# Patient Record
Sex: Female | Born: 1965 | Race: White | Hispanic: No | Marital: Married | Smoking: Never smoker
Health system: Southern US, Community
[De-identification: ages and names within clinical notes are randomized; demographics above are authoritative.]

## PROBLEM LIST (undated history)

## (undated) DIAGNOSIS — M1990 Unspecified osteoarthritis, unspecified site: Secondary | ICD-10-CM

## (undated) DIAGNOSIS — Z8489 Family history of other specified conditions: Secondary | ICD-10-CM

## (undated) DIAGNOSIS — I10 Essential (primary) hypertension: Secondary | ICD-10-CM

## (undated) DIAGNOSIS — K219 Gastro-esophageal reflux disease without esophagitis: Secondary | ICD-10-CM

## (undated) DIAGNOSIS — C801 Malignant (primary) neoplasm, unspecified: Secondary | ICD-10-CM

## (undated) HISTORY — PX: TOOTH EXTRACTION: SUR596

## (undated) HISTORY — DX: Essential (primary) hypertension: I10

## (undated) HISTORY — PX: MYRINGOTOMY: SUR874

## (undated) HISTORY — PX: MULTIPLE TOOTH EXTRACTIONS: SHX2053

## (undated) HISTORY — PX: OTHER SURGICAL HISTORY: SHX169

---

## 1998-12-28 ENCOUNTER — Other Ambulatory Visit: Admission: RE | Admit: 1998-12-28 | Discharge: 1998-12-28 | Payer: Self-pay | Admitting: *Deleted

## 2000-01-06 ENCOUNTER — Other Ambulatory Visit: Admission: RE | Admit: 2000-01-06 | Discharge: 2000-01-06 | Payer: Self-pay | Admitting: *Deleted

## 2000-02-20 ENCOUNTER — Other Ambulatory Visit: Admission: RE | Admit: 2000-02-20 | Discharge: 2000-02-20 | Payer: Self-pay | Admitting: *Deleted

## 2000-06-22 ENCOUNTER — Other Ambulatory Visit: Admission: RE | Admit: 2000-06-22 | Discharge: 2000-06-22 | Payer: Self-pay | Admitting: *Deleted

## 2016-08-31 ENCOUNTER — Other Ambulatory Visit: Payer: Self-pay | Admitting: Internal Medicine

## 2016-08-31 ENCOUNTER — Ambulatory Visit
Admission: RE | Admit: 2016-08-31 | Discharge: 2016-08-31 | Disposition: A | Discharge status: Home / Self Care | Payer: BLUE CROSS/BLUE SHIELD | Source: Ambulatory Visit | Attending: Internal Medicine | Admitting: Internal Medicine

## 2016-08-31 DIAGNOSIS — M25551 Pain in right hip: Secondary | ICD-10-CM

## 2016-09-04 ENCOUNTER — Other Ambulatory Visit: Payer: Self-pay | Admitting: Internal Medicine

## 2016-09-04 DIAGNOSIS — Z1231 Encounter for screening mammogram for malignant neoplasm of breast: Secondary | ICD-10-CM

## 2016-09-11 ENCOUNTER — Ambulatory Visit
Admission: RE | Admit: 2016-09-11 | Discharge: 2016-09-11 | Disposition: A | Discharge status: Home / Self Care | Payer: BLUE CROSS/BLUE SHIELD | Source: Ambulatory Visit | Attending: Internal Medicine | Admitting: Internal Medicine

## 2016-09-11 DIAGNOSIS — Z1231 Encounter for screening mammogram for malignant neoplasm of breast: Secondary | ICD-10-CM

## 2016-09-12 ENCOUNTER — Other Ambulatory Visit: Payer: Self-pay | Admitting: Internal Medicine

## 2016-09-12 DIAGNOSIS — R928 Other abnormal and inconclusive findings on diagnostic imaging of breast: Secondary | ICD-10-CM

## 2016-09-15 ENCOUNTER — Other Ambulatory Visit: Payer: Self-pay | Admitting: Internal Medicine

## 2016-09-15 ENCOUNTER — Ambulatory Visit
Admission: RE | Admit: 2016-09-15 | Discharge: 2016-09-15 | Disposition: A | Discharge status: Home / Self Care | Payer: BLUE CROSS/BLUE SHIELD | Source: Ambulatory Visit | Attending: Internal Medicine | Admitting: Internal Medicine

## 2016-09-15 DIAGNOSIS — R928 Other abnormal and inconclusive findings on diagnostic imaging of breast: Secondary | ICD-10-CM

## 2016-09-15 DIAGNOSIS — N63 Unspecified lump in unspecified breast: Secondary | ICD-10-CM

## 2016-09-19 ENCOUNTER — Ambulatory Visit (INDEPENDENT_AMBULATORY_CARE_PROVIDER_SITE_OTHER): Payer: BLUE CROSS/BLUE SHIELD | Admitting: Orthopaedic Surgery

## 2016-09-19 ENCOUNTER — Other Ambulatory Visit (INDEPENDENT_AMBULATORY_CARE_PROVIDER_SITE_OTHER): Payer: Self-pay

## 2016-09-19 ENCOUNTER — Encounter (INDEPENDENT_AMBULATORY_CARE_PROVIDER_SITE_OTHER): Payer: Self-pay | Admitting: Orthopaedic Surgery

## 2016-09-19 DIAGNOSIS — M25551 Pain in right hip: Secondary | ICD-10-CM

## 2016-09-19 DIAGNOSIS — M1611 Unilateral primary osteoarthritis, right hip: Secondary | ICD-10-CM | POA: Insufficient documentation

## 2016-09-19 NOTE — Progress Notes (Signed)
Office Visit Note   Patient: Stacy [name]           Date of Birth: 02/18/1966           MRN: [id number] Visit Date: 09/19/2016              Requested by: Audley Hose, MD [place], [place] [address] PCP: Audley Hose, MD   Assessment & Plan: Visit Diagnoses:  1. Pain of right hip joint   2. Unilateral primary osteoarthritis, right hip     Plan: Given the severity of her hip disease I do feel that she is a candidate for hip replacement surgery. We had a long and thorough discussion about this and I showed her hip model and went over x-rays as well. She would like to try an intra-articular steroid injection in her right hip and I do feel this is reasonable. We will set this up with Dr. Ernestina Patches. I will see her back myself in 4 weeks to see how she is doing overall. All questions were encouraged and answered.  Follow-Up Instructions: Return in about 4 weeks (around 10/17/2016).   Orders:  No orders of the defined types were placed in this encounter.  No orders of the defined types were placed in this encounter.     Procedures: No procedures performed   Clinical Data: No additional findings.   Subjective: Chief Complaint  Patient presents with  . Right Hip - Pain  The patient is a very pleasant 51 year old female who comes in with chief complaint of right hip pain. It has been slowly worsening for 5-1/2 years now. She states the pain is in her groin and all around her right hip. It is worse with activities. She states that the entire hip hurts. She says that it locks up with activities. She says it does get better with movement after about 15-20 minutes. After she's been sitting for a while and gets up the hip is very stiff. She does take ibuprofen or Aleve. She is working activity modification and weight loss. He can be 10 out of 10 at times. It is detrimentally affected activities daily living. Her quality of life. Her  mobility.  HPI  Review of Systems She denies any chest pain, shortness of breath, fever, chills, nausea, vomiting.  Objective: Vital Signs: There were no vitals taken for this visit.  Physical Exam She is alert or 3 and in no acute distress. She walks with a limp but no assistive device. Ortho Exam Examination of her left hip is normal with full and fluid range of motion. Examination of right hip shows blocks to internal/external rotation some due to pain and some due to decreased dexterity of the hip itself. Her leg lengths are equal. Her knee exam is normal bilaterally. Specialty Comments:  No specialty comments available.  Imaging: No results found. X-rays on the canopy system independently reviewed by me of her pelvis and right hip show severe end-stage arthritis of the right hip. There is complete loss of superior lateral joint space. There is a trigger osteophytes. Severe joint space narrowing is recognized. There is cystic changes and sclerotic changes in the femoral head and the acetabulum.  PMFS History: Patient Active Problem List   Diagnosis Date Noted  . Pain of right hip joint 09/19/2016  . Unilateral primary osteoarthritis, right hip 09/19/2016   No past medical history on file.  Family History  Problem Relation Age of Onset  . Breast  cancer Mother     No past surgical history on file. Social History   Occupational History  . Not on file.   Social History Main Topics  . Smoking status: Never Smoker  . Smokeless tobacco: Never Used  . Alcohol use Not on file  . Drug use: Unknown  . Sexual activity: Not on file

## 2016-09-25 ENCOUNTER — Ambulatory Visit (INDEPENDENT_AMBULATORY_CARE_PROVIDER_SITE_OTHER): Payer: BLUE CROSS/BLUE SHIELD

## 2016-09-25 ENCOUNTER — Encounter (INDEPENDENT_AMBULATORY_CARE_PROVIDER_SITE_OTHER): Payer: Self-pay | Admitting: Physical Medicine and Rehabilitation

## 2016-09-25 ENCOUNTER — Ambulatory Visit (INDEPENDENT_AMBULATORY_CARE_PROVIDER_SITE_OTHER): Payer: BLUE CROSS/BLUE SHIELD | Admitting: Physical Medicine and Rehabilitation

## 2016-09-25 DIAGNOSIS — M25551 Pain in right hip: Secondary | ICD-10-CM | POA: Diagnosis not present

## 2016-09-25 MED ORDER — TRIAMCINOLONE ACETONIDE 40 MG/ML IJ SUSP
80.0000 mg | INTRAMUSCULAR | Status: AC | PRN
  Administered 2016-09-25: 80 mg via INTRA_ARTICULAR

## 2016-09-25 MED ORDER — BUPIVACAINE HCL 0.5 % IJ SOLN
3.0000 mL | INTRAMUSCULAR | Status: AC | PRN
  Administered 2016-09-25: 3 mL via INTRA_ARTICULAR

## 2016-09-25 NOTE — Progress Notes (Deleted)
Right hip pain for several years. Comes and goes. Pain with walking and getting up from seated position. Gets very stiff after sitting long periods. Denies any groin pain or pain down leg.

## 2016-09-25 NOTE — Patient Instructions (Signed)

## 2016-09-25 NOTE — Progress Notes (Signed)
Stacy [name] - 51 y.o. female MRN [id number]  Date of birth: 11-15-1965  Office Visit Note: Visit Date: 09/25/2016 PCP: Audley Hose, MD Referred by: Audley Hose, MD  Subjective: Chief Complaint  Patient presents with  . Right Hip - Pain   HPI: Ms. Stacy [name] is a 51 year old female with several year history of right hip pain. Her hip pain can be intermittent at times but severe at times. She has a lot of pain with walking and getting up from a seated position. She has a lot of stiffness particularly on the right low back and hip area. She denies any real specific groin pain really pain down the leg or paresthesias. She does get locking type pain in the right hip. She saw Dr. Ninfa Linden recently for evaluation and management. X-rays have shown end-stage arthritis of the right hip. She is somewhat anxious today about the injection.    ROS Otherwise per HPI.  Assessment & Plan: Visit Diagnoses:  1. Pain in right hip     Plan: Findings:  Diagnostic and therapeutic anesthetic hip arthrogram. The patient did get relief of her hip pain during the anesthetic phase of the injection. The patient had quite a bit of pain during the delivery of the injectate I think this will volume standpoint. She did have a vasovagal reaction as well at this point that did okay ultimately. She was discharged without problem.    Meds & Orders: No orders of the defined types were placed in this encounter.   Orders Placed This Encounter  Procedures  . Large Joint Injection/Arthrocentesis  . XR C-ARM NO REPORT    Follow-up: Return for Dr. Ninfa Linden.   Procedures: Diagnostic and therapeutic anesthetic hip arthrogram Date/Time: 09/25/2016 1:21 PM Performed by: Magnus Sinning Authorized by: Magnus Sinning   Consent Given by:  Patient Site marked: the procedure site was marked   Timeout: prior to procedure the correct patient, procedure, and site was verified   Indications:  Pain and diagnostic  evaluation Location:  Hip Site:  R hip joint Prep: patient was prepped and draped in usual sterile fashion   Needle Size:  22 G Approach:  Anterior Ultrasound Guidance: No   Fluoroscopic Guidance: No   Arthrogram: Yes   Medications:  3 mL bupivacaine 0.5 %; 80 mg triamcinolone acetonide 40 MG/ML Aspiration Attempted: Yes   Patient tolerance of procedure: Patient tolerated the actual injection without difficulty but during the injectate delivery from a volume standpoint she had quite a bit of pain. She also had a vasovagal reaction at that point but was discharged without problems.  Arthrogram demonstrated excellent flow of contrast throughout the joint surface without extravasation or obvious defect.  The patient had relief of symptoms during the anesthetic phase of the injection.      No notes on file   Clinical History: No specialty comments available.  She reports that she has never smoked. She has never used smokeless tobacco. No results for input(s): HGBA1C, LABURIC in the last 8760 hours.  Objective:  VS:  HT:    WT:   BMI:     BP:   HR: bpm  TEMP: ( )  RESP:  Physical Exam  Ortho Exam Imaging: Xr C-arm No Report  Result Date: 09/25/2016 Please see Notes or Procedures tab for imaging impression.   Past Medical/Family/Surgical/Social History: Medications & Allergies reviewed per EMR Patient Active Problem List   Diagnosis Date Noted  . Pain of right hip joint 09/19/2016  .  Unilateral primary osteoarthritis, right hip 09/19/2016   No past medical history on file. Family History  Problem Relation Age of Onset  . Breast cancer Mother    No past surgical history on file. Social History   Occupational History  . Not on file.   Social History Main Topics  . Smoking status: Never Smoker  . Smokeless tobacco: Never Used  . Alcohol use Not on file  . Drug use: Unknown  . Sexual activity: Not on file

## 2016-10-04 ENCOUNTER — Ambulatory Visit (INDEPENDENT_AMBULATORY_CARE_PROVIDER_SITE_OTHER): Payer: BLUE CROSS/BLUE SHIELD | Admitting: Obstetrics & Gynecology

## 2016-10-04 ENCOUNTER — Encounter: Payer: Self-pay | Admitting: Obstetrics & Gynecology

## 2016-10-04 ENCOUNTER — Ambulatory Visit (INDEPENDENT_AMBULATORY_CARE_PROVIDER_SITE_OTHER): Payer: BLUE CROSS/BLUE SHIELD | Admitting: Clinical

## 2016-10-04 VITALS — BP 162/82 | HR 86 | Ht 65.5 in | Wt 169.5 lb

## 2016-10-04 DIAGNOSIS — F4320 Adjustment disorder, unspecified: Secondary | ICD-10-CM | POA: Diagnosis not present

## 2016-10-04 DIAGNOSIS — Z01419 Encounter for gynecological examination (general) (routine) without abnormal findings: Secondary | ICD-10-CM | POA: Diagnosis not present

## 2016-10-04 DIAGNOSIS — N951 Menopausal and female climacteric states: Secondary | ICD-10-CM

## 2016-10-04 DIAGNOSIS — G479 Sleep disorder, unspecified: Secondary | ICD-10-CM

## 2016-10-04 DIAGNOSIS — F4321 Adjustment disorder with depressed mood: Secondary | ICD-10-CM

## 2016-10-04 MED ORDER — CONJ ESTROG-MEDROXYPROGEST ACE 0.625-2.5 MG PO TABS: 1.0000 | ORAL_TABLET | Freq: Every day | ORAL | 3 refills | Status: DC

## 2016-10-04 NOTE — Patient Instructions (Addendum)
Perimenopause Perimenopause is the time when your body begins to move into the menopause (no menstrual period for 12 straight months). It is a natural process. Perimenopause can begin 2-8 years before the menopause and usually lasts for 1 year after the menopause. During this time, your ovaries may or may not produce an egg. The ovaries vary in their production of estrogen and progesterone hormones each month. This can cause irregular menstrual periods, difficulty getting pregnant, vaginal bleeding between periods, and uncomfortable symptoms. What are the causes?  Irregular production of the ovarian hormones, estrogen and progesterone, and not ovulating every month. Other causes include:  Tumor of the pituitary gland in the brain.  Medical disease that affects the ovaries.  Radiation treatment.  Chemotherapy.  Unknown causes.  Heavy smoking and excessive alcohol intake can bring on perimenopause sooner.  What are the signs or symptoms?  Hot flashes.  Night sweats.  Irregular menstrual periods.  Decreased sex drive.  Vaginal dryness.  Headaches.  Mood swings.  Depression.  Memory problems.  Irritability.  Tiredness.  Weight gain.  Trouble getting pregnant.  The beginning of losing bone cells (osteoporosis).  The beginning of hardening of the arteries (atherosclerosis). How is this diagnosed? Your health care provider will make a diagnosis by analyzing your age, menstrual history, and symptoms. He or she will do a physical exam and note any changes in your body, especially your female organs. Female hormone tests may or may not be helpful depending on the amount of female hormones you produce and when you produce them. However, other hormone tests may be helpful to rule out other problems. How is this treated? In some cases, no treatment is needed. The decision on whether treatment is necessary during the perimenopause should be made by you and your health care  provider based on how the symptoms are affecting you and your lifestyle. Various treatments are available, such as:  Treating individual symptoms with a specific medicine for that symptom.  Herbal medicines that can help specific symptoms.  Counseling.  Group therapy.  Follow these instructions at home:  Keep track of your menstrual periods (when they occur, how heavy they are, how long between periods, and how long they last) as well as your symptoms and when they started.  Only take over-the-counter or prescription medicines as directed by your health care provider.  Sleep and rest.  Exercise.  Eat a diet that contains calcium (good for your bones) and soy (acts like the estrogen hormone).  Do not smoke.  Avoid alcoholic beverages.  Take vitamin supplements as recommended by your health care provider. Taking vitamin E may help in certain cases.  Take calcium and vitamin D supplements to help prevent bone loss.  Group therapy is sometimes helpful.  Acupuncture may help in some cases. Contact a health care provider if:  You have questions about any symptoms you are having.  You need a referral to a specialist (gynecologist, psychiatrist, or psychologist). Get help right away if:  You have vaginal bleeding.  Your period lasts longer than 8 days.  Your periods are recurring sooner than 21 days.  You have bleeding after intercourse.  You have severe depression.  You have pain when you urinate.  You have severe headaches.  You have vision problems. This information is not intended to replace advice given to you by your health care provider. Make sure you discuss any questions you have with your health care provider. Document Released: 04/06/2004 Document Revised: 08/05/2015 Document Reviewed: 09/26/2012  severe depression.  · You have pain when you urinate.  · You have severe headaches.  · You have vision problems.  This information is not intended to replace advice given to you by your health care provider. Make sure you discuss any questions you have with your health care provider.  Document Released: 04/06/2004 Document Revised: 08/05/2015 Document Reviewed: 09/26/2012  Elsevier Interactive Patient Education © 2017 [employer]      Menopause and Hormone Replacement Therapy  What is hormone replacement therapy?  Hormone  replacement therapy (HRT) is the use of artificial (synthetic) hormones to replace hormones that your body stops producing during menopause.  Menopause is the normal time of life when menstrual periods stop completely and the ovaries stop producing the female hormones estrogen and progesterone. This lack of hormones can affect your health and cause undesirable symptoms. HRT can relieve some of those symptoms.  What are my options for HRT?  HRT may consist of the synthetic hormones estrogen and progestin, or it may consist of only estrogen (estrogen-only therapy). You and your health care provider will decide which form of HRT is best for you.  If you choose to be on HRT and you have a uterus, estrogen and progestin are usually prescribed. Estrogen-only therapy is used for women who do not have a uterus.  Possible options for taking HRT include:  · Pills.  · Patches.  · Gels.  · Sprays.  · Vaginal cream.  · Vaginal rings.  · Vaginal inserts.    The amount of hormone(s) that you take and how long you take the hormone(s) varies depending on your individual health. It is important to:  · Begin HRT with the lowest possible dosage.  · Stop HRT as soon as your health care provider tells you to stop.  · Work with your health care provider so that you feel informed and comfortable with your decisions.    What are the benefits of HRT?  HRT can reduce the frequency and severity of menopausal symptoms. Benefits of HRT vary depending on the menopausal symptoms that you have, the severity of your symptoms, and your overall health.  HRT may help to improve the following menopausal symptoms:  · Hot flashes and night sweats. These are sudden feelings of heat that spread over the face and body. The skin may turn red, like a blush. Night sweats are hot flashes that happen while you are sleeping or trying to sleep.  · Bone loss (osteoporosis). The body loses calcium more quickly after menopause, causing the bones to become weaker. This  can increase the risk for bone breaks (fractures).  · Vaginal dryness. The lining of the vagina can become thin and dry, which can cause pain during sexual intercourse or cause infection, burning, or itching.  · Urinary tract infections.  · Urinary incontinence. This is a decreased ability to control when you urinate.  · Irritability.  · Short-term memory problems.    What are the risks of HRT?  Risks of HRT vary depending on your individual health and medical history. Risks of HRT also depend on whether you receive both estrogen and progestin or you receive estrogen only. HRT may increase the risk of:  · Spotting. This is when a small amount of blood leaks from the vagina unexpectedly.  · Endometrial cancer. This cancer is in the lining of the uterus (endometrium).  · Breast cancer.  · Increased density of breast tissue. This can make it harder to find breast cancer on a   breast X-ray (mammogram).  · Stroke.  · Heart attack.  · Blood clots.  · Gallbladder disease.    Risks of HRT can increase if you have any of the following conditions:  · Endometrial cancer.  · Liver disease.  · Heart disease.  · Breast cancer.  · History of blood clots.  · History of stroke.    How should I care for myself while I am on HRT?  · Take over-the-counter and prescription medicines only as told by your health care provider.  · Get mammograms, pelvic exams, and medical checkups as often as told by your health care provider.  · Have Pap tests done as often as told by your health care provider. A Pap test is sometimes called a Pap smear. It is a screening test that is used to check for signs of cancer of the cervix and vagina. A Pap test can also identify the presence of infection or precancerous changes. Pap tests may be done:  ? Every 3 years, starting at age 21.  ? Every 5 years, starting after age 30, in combination with testing for human papillomavirus (HPV).  ? More often or less often depending on other medical conditions you have,  your age, and other risk factors.  · It is your responsibility to get your Pap test results. Ask your health care provider or the department performing the test when your results will be ready.  · Keep all follow-up visits as told by your health care provider. This is important.  When should I seek medical care?  Talk with your health care provider if:  · You have any of these:  ? Pain or swelling in your legs.  ? Shortness of breath.  ? Chest pain.  ? Lumps or changes in your breasts or armpits.  ? Slurred speech.  ? Pain, burning, or bleeding when you urine.  · You develop any of these:  ? Unusual vaginal bleeding.  ? Dizziness or headaches.  ? Weakness or numbness in any part of your arms or legs.  ? Pain in your abdomen.    This information is not intended to replace advice given to you by your health care provider. Make sure you discuss any questions you have with your health care provider.  Document Released: 11/26/2002 Document Revised: 01/25/2016 Document Reviewed: 08/31/2014  Elsevier Interactive Patient Education © 2017 [employer]

## 2016-10-04 NOTE — Addendum Note (Signed)
Addended by: Christiana Pellant A on: 10/04/2016 10:17 AM   Modules accepted: Orders

## 2016-10-04 NOTE — Progress Notes (Signed)
Pt reports that her last pap smear was in 2002. She had some sort of treatment but is unclear what that was for. She has her records at home.

## 2016-10-04 NOTE — Progress Notes (Signed)
Subjective:     Stacy [name] is a 51 y.o. female here for a routine exam. G0 LMP end of Dec 2017 (~8 months prev).  Current complaints: last exam was >15 year prev.    Pt reports hot flushes that are sl better on black Cohosh. Pt wakes up every night 1-3 times at night with sweats.  Not as intense as prev.  She had her thyroid check and it was WNL.  Pt denies pain with intercourse.    Pt had cortisol in right hip. OA. On HCTZ for HBP.     Gynecologic History No LMP recorded. Patient is not currently having periods (Reason: Perimenopausal). Contraception: post menopausal status Last Pap: >15 years prev.  Results were: normal Last mammogram: Bi-rad 3 needs repeat US in 6 months . Pt aware.   Obstetric History OB History  Gravida Para Term Preterm AB Living  0 0 0 0 0 0  SAB TAB Ectopic Multiple Live Births  0 0 0 0 0       The following portions of the patient's history were reviewed and updated as appropriate: allergies, current medications, past family history, past medical history, past social history, past surgical history and problem list.  Review of Systems Pertinent items are noted in HPI.    Objective:  BP (!) 162/82   Pulse 86   Ht 5' 5.5" (1.664 m)   Wt 169 lb 8 oz (76.9 kg)   BMI 27.78 kg/m   General Appearance:    Alert, cooperative, no distress, appears stated age  Head:    Normocephalic, without obvious abnormality, atraumatic  Eyes:    conjunctiva/corneas clear, EOM's intact, both eyes  Ears:    Normal external ear canals, both ears  Nose:   Nares normal, septum midline, mucosa normal, no drainage    or sinus tenderness  Throat:   Lips, mucosa, and tongue normal; teeth and gums normal  Neck:   Supple, symmetrical, trachea midline, no adenopathy;    thyroid:  no enlargement/tenderness/nodules  Back:     Symmetric, no curvature, ROM normal, no CVA tenderness  Lungs:     Clear to auscultation bilaterally, respirations unlabored  Chest Wall:    No tenderness or  deformity   Heart:    Regular rate and rhythm, S1 and S2 normal, no murmur, rub   or gallop  Breast Exam:    No tenderness, masses, or nipple abnormality  Abdomen:     Soft, non-tender, bowel sounds active all four quadrants,    no masses, no organomegaly  Genitalia:    Normal female without lesion, discharge or tenderness; slightly atrophic; adequate lubrication.     Extremities:   Extremities normal, atraumatic, no cyanosis or edema  Pulses:   2+ and symmetric all extremities  Skin:   Skin color, texture, turgor normal, no rashes or lesions     Assessment:    Healthy female exam.   Menopausal sx-hot flushes are the worst Incontinence- pt mentioned this at the end of the visit. Has tried Kegels in the past. Will reassess after 3 months of ERT.    Plan:    Follow up in: 3 months.    Repeat breast US In 6 months prempro .625/2.5mg  daily  Patient with bothersome menopausal vasomotor symptoms. Discussed lifestyle interventions such as wearing light clothing, remaining in cool environments, having fan/air conditioner in the room, avoiding hot beverages etc.  Discussed using hormone therapy and concerns about increased risk of heart disease, cerebrovascular disease, thromboembolic  disease,  and breast cancer.  Also discussed other medical options such as Paxil, Effexor or Neurontin.   Also discussed alternative therapies such as herbal remedies but cautioned that most of the products contained phytoestrogens (plant estrogens) in unregulated amounts which can have the same effects on the body as the pharmaceutical estrogen preparations.  Also referred her to www.menopause.org for other alternative options.  Patient opted for combined estrogen therapy for now, wants to try the oral formulation.  prempro .625/2.5 mg given She will return in 2 months for reevaluation.  Delvina Mizzell L. Harraway-Smith, M.D., Cherlynn June

## 2016-10-04 NOTE — BH Specialist Note (Signed)
Integrated Behavioral Health Initial Visit  MRN: [id number] Name: Stacy [name]   Session Start time: 10:10 Session End time: 10:40 Total time: 30 minutes  Type of Service: [place] Interpretor:No. Interpretor Name and Language: n/a   Warm Hand Off Completed.       SUBJECTIVE: Stacy [name] is a 51 y.o. female accompanied by patient. Patient was referred by Dr Ihor Dow for grief. Patient reports the following symptoms/concerns: Pt states her primary concern is having difficulty making life decisions after her father passed away 7 weeks ago; helps to attend weekly musical group meeting and using various sleep sounds, but does not use consistently. 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  Self-Care: Plays the recorder, attends weekly recorder group, sleep videos(asmr,etc) for self-care  Life Changes: Hot flashes since January, high blood pressure, father passed away 7 weeks prior, taking care of 28yo mother since father's passing, reduction in work while caring for parents in past year  GOALS ADDRESSED: Patient will reduce symptoms of: anxiety, depression and stress and increase knowledge and/or ability of: self-management skills and stress reduction and also: Increase healthy adjustment to current life circumstances, Increase adequate support systems for patient/family and Begin healthy grieving over loss   INTERVENTIONS: Solution-Focused Strategies, Psychoeducation and/or Health Education and Link to [employer]  Standardized Assessments completed: GAD-7 and PHQ 9  ASSESSMENT: Patient currently experiencing Grief. Patient may benefit from  psychoeducation and brief therapeutic intervention regarding coping with adjustment after grief.  PLAN: 1. Follow up with behavioral health clinician on : As needed 2. Behavioral recommendations:  -Attend weekly recording group as a priority -Begin Worry Hour strategy to prioritize life stressors into manageable action plan -Use sleep sounds/videos every night, as part of sleep routine; consider sleep app or sound machine for greater consistency -Consider [facility] for workshops/classes(networking,resume,etc.) -Read educational material regarding coping with symptoms of anxiety with panic attack and depression  3. Referral(s): [place] (In Clinic) and [employer] Resources:  [employer] 4. "From scale of 1-10, how likely are you to follow plan?": 9  Stacy [name], LCSWA  Depression screen [facility] 2/9 10/04/2016  Decreased Interest 1  Down, Depressed, Hopeless 1  PHQ - 2 Score 2  Altered sleeping 0  Tired, decreased energy 2  Change in appetite 1  Feeling bad or failure about yourself  3  Trouble concentrating 0  Moving slowly or fidgety/restless 0  Suicidal thoughts 0  PHQ-9 Score 8   GAD 7 : Generalized Anxiety Score 10/04/2016  Nervous, Anxious, on Edge 1  Control/stop worrying 1  Worry too much - different things 1  Trouble relaxing 1  Restless 0  Easily annoyed or irritable 1  Afraid - awful might happen 1  Total GAD 7 Score 6

## 2016-10-06 LAB — CYTOLOGY - PAP
Diagnosis: NEGATIVE
HPV (WINDOPATH): NOT DETECTED

## 2016-10-11 ENCOUNTER — Telehealth: Payer: Self-pay | Admitting: Clinical

## 2016-10-11 NOTE — Telephone Encounter (Signed)
Follow-up after office visit call: Pt says she is starting to feel like she is successfully making adjustments to life changes, and confirmed that she will call back at [phone] to make her f/u appointment with medical provider in about 3 months, per her last AVS.

## 2016-10-13 ENCOUNTER — Other Ambulatory Visit: Payer: Self-pay | Admitting: Gastroenterology

## 2016-10-13 DIAGNOSIS — C189 Malignant neoplasm of colon, unspecified: Secondary | ICD-10-CM

## 2016-10-17 ENCOUNTER — Ambulatory Visit (INDEPENDENT_AMBULATORY_CARE_PROVIDER_SITE_OTHER): Payer: BLUE CROSS/BLUE SHIELD | Admitting: Physician Assistant

## 2016-10-17 DIAGNOSIS — M1611 Unilateral primary osteoarthritis, right hip: Secondary | ICD-10-CM | POA: Diagnosis not present

## 2016-10-17 NOTE — Progress Notes (Signed)
Stalder returns today follow-up of her right hip pain status post intra-articular injection on 09/25/2016. She states overall that the hip is much improved. Still has some pain in the hip. She unfortunately had a colonoscopy since she was last seen by Dr. Ninfa Linden and had a malignant polyp. She is due to see her general surgeon soon and discuss treatment she will require for this. However she does have many questions about total hip replacement particularly an anterior hip replacement.  Right hip: Diminished in internal and external rotation. Some difficult discomfort with these maneuvers  Impression: Right hip osteoarthritis  Plan: Patient's list of questions in regards to hip replacement were answered for her today. This did take greater than 15 minutes. She'll contact our office prior to any scheduling of a right total hip arthroplasty. Did discuss with her that she should plan for an office visit at least a month prior to wanting her surgery. She can continue with conservative treatment which would be an intra-articular injections as frequent as every 6 months in the right hip.

## 2016-10-18 ENCOUNTER — Ambulatory Visit
Admission: RE | Admit: 2016-10-18 | Discharge: 2016-10-18 | Disposition: A | Discharge status: Home / Self Care | Payer: BLUE CROSS/BLUE SHIELD | Source: Ambulatory Visit | Attending: Gastroenterology | Admitting: Gastroenterology

## 2016-10-18 ENCOUNTER — Other Ambulatory Visit: Payer: Self-pay

## 2016-10-18 DIAGNOSIS — C189 Malignant neoplasm of colon, unspecified: Secondary | ICD-10-CM

## 2016-10-18 MED ORDER — IOPAMIDOL (ISOVUE-300) INJECTION 61%
100.0000 mL | Freq: Once | INTRAVENOUS | Status: AC | PRN
  Administered 2016-10-18: 100 mL via INTRAVENOUS

## 2016-10-20 ENCOUNTER — Other Ambulatory Visit: Payer: Self-pay | Admitting: General Surgery

## 2016-10-20 MED ORDER — SODIUM CHLORIDE 0.9 % IV SOLN: 1.0000 "application " | INTRAVENOUS | Status: AC

## 2016-11-01 ENCOUNTER — Other Ambulatory Visit (HOSPITAL_COMMUNITY): Payer: Self-pay | Admitting: *Deleted

## 2016-11-01 NOTE — Pre-Procedure Instructions (Signed)
    Jayel Scaduto  11/01/2016      [id number], [place] [address] Phone: [phone] Fax: [phone]   Your procedure is scheduled on Thursday, November 09, 2016 at 7:30 AM.   Report to [facility] Entrance "A" Admitting Office at 5:30 AM.   Call this number if you have problems the morning of surgery: [phone]   Questions prior to day of surgery, please call [phone] between 8 & 4 PM.   Remember:  Do not eat food or drink liquids after midnight Wednesday, 11/08/16.  Take these medicines the morning of surgery with A SIP OF WATER: Prempro  Stop Herbal Medications 5 days prior to surgery. Do not use NSAIDS (Aleve, Ibuprofen, etc) or Aspirin products 5 days prior to surgery.  Drink "Boost" 2 hours prior to time of arrival on Thursday. Drink it by 3:30 AM.   Do not wear jewelry, make-up or nail polish.  Do not wear lotions, powders, perfumes or deodorant.  Do not shave 48 hours prior to surgery.    Do not bring valuables to the hospital.  [facility] is not responsible for any belongings or valuables.  Contacts, dentures or bridgework may not be worn into surgery.  Leave your suitcase in the car.  After surgery it may be brought to your room.  For patients admitted to the hospital, discharge time will be determined by your treatment team.  See "Preparing for Surgery" Instruction sheet.   Please read over the fact sheets that you were given.

## 2016-11-02 ENCOUNTER — Encounter (HOSPITAL_COMMUNITY)
Admission: RE | Admit: 2016-11-02 | Discharge: 2016-11-02 | Disposition: A | Discharge status: Home / Self Care | Payer: BLUE CROSS/BLUE SHIELD | Source: Ambulatory Visit | Attending: General Surgery | Admitting: General Surgery

## 2016-11-02 ENCOUNTER — Encounter (HOSPITAL_COMMUNITY): Payer: Self-pay

## 2016-11-02 DIAGNOSIS — C180 Malignant neoplasm of cecum: Secondary | ICD-10-CM | POA: Insufficient documentation

## 2016-11-02 DIAGNOSIS — Z01810 Encounter for preprocedural cardiovascular examination: Secondary | ICD-10-CM | POA: Insufficient documentation

## 2016-11-02 DIAGNOSIS — I4510 Unspecified right bundle-branch block: Secondary | ICD-10-CM | POA: Diagnosis not present

## 2016-11-02 HISTORY — DX: Family history of other specified conditions: Z84.89

## 2016-11-02 HISTORY — DX: Unspecified osteoarthritis, unspecified site: M19.90

## 2016-11-02 HISTORY — DX: Gastro-esophageal reflux disease without esophagitis: K21.9

## 2016-11-02 LAB — CBC
HEMATOCRIT: 44.4 % (ref 36.0–46.0)
HEMOGLOBIN: 15.3 g/dL — AB (ref 12.0–15.0)
MCH: 28.6 pg (ref 26.0–34.0)
MCHC: 34.5 g/dL (ref 30.0–36.0)
MCV: 83 fL (ref 78.0–100.0)
Platelets: 213 10*3/uL (ref 150–400)
RBC: 5.35 MIL/uL — ABNORMAL HIGH (ref 3.87–5.11)
RDW: 13.2 % (ref 11.5–15.5)
WBC: 8.6 10*3/uL (ref 4.0–10.5)

## 2016-11-02 LAB — BASIC METABOLIC PANEL
ANION GAP: 8 (ref 5–15)
BUN: 13 mg/dL (ref 6–20)
CALCIUM: 9.3 mg/dL (ref 8.9–10.3)
CO2: 26 mmol/L (ref 22–32)
Chloride: 106 mmol/L (ref 101–111)
Creatinine, Ser: 1.06 mg/dL — ABNORMAL HIGH (ref 0.44–1.00)
GFR calc Af Amer: >60 mL/min (ref >60)
GFR calc non Af Amer: 59 mL/min — ABNORMAL LOW (ref >60)
GLUCOSE: 92 mg/dL (ref 65–99)
POTASSIUM: 3.4 mmol/L — AB (ref 3.5–5.1)
Sodium: 140 mmol/L (ref 135–145)

## 2016-11-02 LAB — HCG, SERUM, QUALITATIVE: PREG SERUM: NEGATIVE

## 2016-11-02 LAB — ABO/RH: ABO/RH(D): O POS

## 2016-11-02 LAB — HEMOGLOBIN A1C
HEMOGLOBIN A1C: 5.1 % (ref 4.8–5.6)
Mean Plasma Glucose: 99.67 mg/dL

## 2016-11-02 LAB — TYPE AND SCREEN
ABO/RH(D): O POS
Antibody Screen: NEGATIVE

## 2016-11-02 NOTE — Progress Notes (Signed)
PCP: Dr. Marye Round @ [facility]. In [place]  Pt. Reporting she is having a tooth extracted Monday 11/06/16. Dr. Barry Dienes is aware.

## 2016-11-03 LAB — CEA: CEA1: 0.5 ng/mL (ref 0.0–4.7)

## 2016-11-06 NOTE — H&P (Signed)
KEELIA GRAYBILL 10/20/2016 11:43 AM Location: [facility] Patient #: [id number] DOB: 06-06-1965 Married / Language: Cleophus Molt / Race: White Female   History of Present Illness Stark Klein MD; 10/20/2016 1:42 PM) The patient is a 51 year old female who presents with colorectal cancer. Patient is a 51 year old female referred for consultation by Dr. 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 The tumor size was 0.4 cm. The deep margin was positive. This was staged at least a pT1. MSI testing was pending.  The patient denies any abdominal pain. She does feel like she has somewhat of a slow digestive time. She says sometimes her stools are soft and occasionally will use. She denies any bleeding. She has not had any nausea, vomiting, or change in her bowel habits. She denies any weight loss. Labs were performed at [facility] and his CBC was essentially normal. CEA was performed and was 0.7. A complete metabolic panel was also essentially normal.  Her father and paternal grandmother had colon cancer. Her father's cancer with diagnosed in his 89s.   Past Surgical History (Tanisha A. Owens Shark, [place]; 10/20/2016 11:43 AM) Tonsillectomy   Diagnostic Studies History (Tanisha A. Owens Shark, [place]; 10/20/2016 11:43 AM) Colonoscopy  within last year Mammogram  within last year Pap Smear  1-5 years ago  Allergies (Tanisha A. Owens Shark, [place]; 10/20/2016 11:44 AM) No Known Drug Allergies 10/20/2016 Allergies Reconciled   Medication History (Tanisha A. Owens Shark, [place]; 10/20/2016 11:45 AM) HydroCHLOROthiazide (25MG Tablet, Oral) Active. Potassium Chloride ER (10MEQ Tablet ER, Oral) Active. Prempro (0.625-2.5MG Tablet, Oral) Active. PEG 3350-KCl-Na  Bicarb-NaCl (420GM For Solution, Oral) Active. Estrogens Conj Synthetic A (0.3MG Tablet, Oral) Active. Medications Reconciled  Social History (Tanisha A. Owens Shark, [place]; 10/20/2016 11:43 AM) Alcohol use  Occasional alcohol use. Caffeine use  Carbonated beverages. No drug use  Tobacco use  Never smoker.  Family History (Tanisha A. Owens Shark, [place]; 10/20/2016 11:43 AM) Anesthetic complications  Father. Breast Cancer  Mother. Colon Cancer  Father. Colon Polyps  Father. Depression  Father. Hypertension  Father, Mother.  Pregnancy / Birth History (Tanisha A. Owens Shark, [place]; 10/20/2016 11:43 AM) Age at menarche  5 years.  Other Problems (Tanisha A. Owens Shark, [place]; 10/20/2016 11:43 AM) Colon Cancer  High blood pressure     Review of Systems (Tanisha A. Brown RMA; 10/20/2016 11:43 AM) General Not Present- Appetite Loss, Chills, Fatigue, Fever, Night Sweats, Weight Gain and Weight Loss. Skin Not Present- Change in Wart/Mole, Dryness, Hives, Jaundice, New Lesions, Non-Healing Wounds, Rash and Ulcer. HEENT Not Present- Earache, Hearing Loss, Hoarseness, Nose Bleed, Oral Ulcers, Ringing in the Ears, Seasonal Allergies, Sinus Pain, Sore Throat, Visual Disturbances, Wears glasses/contact lenses and Yellow Eyes. Respiratory Not Present- Bloody sputum, Chronic Cough, Difficulty Breathing, Snoring and Wheezing. Breast Not Present- Breast Mass, Breast Pain, Nipple Discharge and Skin Changes. Cardiovascular Not Present- Chest Pain, Difficulty Breathing Lying Down, Leg Cramps, Palpitations, Rapid Heart Rate, Shortness of Breath and Swelling of Extremities. Gastrointestinal Not Present- Abdominal Pain, Bloating, Bloody Stool, Change in Bowel Habits, Chronic diarrhea, Constipation, Difficulty Swallowing, Excessive gas, Gets full quickly at meals, Hemorrhoids, Indigestion, Nausea, Rectal Pain and Vomiting. Female Genitourinary Not Present- Frequency, Nocturia, Painful Urination, Pelvic Pain and  Urgency. Musculoskeletal Not Present- Back Pain, Joint Pain, Joint Stiffness, Muscle Pain, Muscle Weakness  and Swelling of Extremities. Neurological Not Present- Decreased Memory, Fainting, Headaches, Numbness, Seizures, Tingling, Tremor, Trouble walking and Weakness. Psychiatric Not Present- Anxiety, Bipolar, Change in Sleep Pattern, Depression, Fearful and Frequent crying. Endocrine Present- Hot flashes. Not Present- Cold Intolerance, Excessive Hunger, Hair Changes, Heat Intolerance and New Diabetes. Hematology Not Present- Blood Thinners, Easy Bruising, Excessive bleeding, Gland problems, HIV and Persistent Infections.  Vitals (Tanisha A. Brown RMA; 10/20/2016 11:44 AM) 10/20/2016 11:43 AM Weight: 169 lb Height: 65in Body Surface Area: 1.84 m Body Mass Index: 28.12 kg/m  Temp.: 97.71F  Pulse: 83 (Regular)  P.OX: 97% (Room air) BP: 142/82 (Sitting, Left Arm, Standard)       Physical Exam Stark Klein MD; 10/20/2016 1:38 PM) General Mental Status-Alert. General Appearance-Consistent with stated age. Hydration-Well hydrated. Voice-Normal.  Head and Neck Head-normocephalic, atraumatic with no lesions or palpable masses. Trachea-midline. Thyroid Gland Characteristics - normal size and consistency.  Eye Eyeball - Bilateral-Extraocular movements intact. Sclera/Conjunctiva - Bilateral-No scleral icterus.  Chest and Lung Exam Chest and lung exam reveals -quiet, even and easy respiratory effort with no use of accessory muscles and on auscultation, normal breath sounds, no adventitious sounds and normal vocal resonance. Inspection Chest Wall - Normal. Back - normal.  Cardiovascular Cardiovascular examination reveals -normal heart sounds, regular rate and rhythm with no murmurs and normal pedal pulses bilaterally.  Abdomen Inspection Inspection of the abdomen reveals - No Hernias. Palpation/Percussion Palpation and Percussion of the abdomen  reveal - Soft, Non Tender, No Rebound tenderness, No Rigidity (guarding) and No hepatosplenomegaly. Auscultation Auscultation of the abdomen reveals - Bowel sounds normal.  Neurologic Neurologic evaluation reveals -alert and oriented x 3 with no impairment of recent or remote memory. Mental Status-Normal.  Musculoskeletal Global Assessment -Note: no gross deformities.  Normal Exam - Left-Upper Extremity Strength Normal and Lower Extremity Strength Normal. Normal Exam - Right-Upper Extremity Strength Normal and Lower Extremity Strength Normal.  Lymphatic Head & Neck  General Head & Neck Lymphatics: Bilateral - Description - Normal. Axillary  General Axillary Region: Bilateral - Description - Normal. Tenderness - Non Tender. Femoral & Inguinal  Generalized Femoral & Inguinal Lymphatics: Bilateral - Description - No Generalized lymphadenopathy.    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 The deep resection margin was positive. Because of that I do recommend a sigmoidectomy. I discussed laparoscopic sigmoid with the patient. I discussed that the main purpose of this is for staging to determine whether she needs any additional. We will contact the lab for MSI testing. She certainly might have lynch syndrome given her family history. She is not interested in a total colectomy at this point.  I discussed risks of surgery wtih the patient including bleedign, infection, damage to adjacent structures, blood clots, anastomotic leak, possible need for additional surgeries or procedures, heart or lung complications, death, hernia, possible wound complications. The patient wishes to proceed. However, she does have issues with scheduling because of upcoming events. She is going to discuss this with her husband. I am writing orders and our schedulers will call her  in a few days.  She was given educational material in the form of the Hospital colon surgery book and my colon information.  60 min spent in evaluation, examination, counseling, and coordination of care. >50% spent in counseling. Current Plans Pt Education - flb sigmoid colon: discussed with patient and provided information.  You are being scheduled for surgery- Our schedulers will call you.  You should hear from our office's scheduling department within 5 working days about the location, date, and time of surgery. We try to make accommodations for patient's preferences in scheduling surgery, but sometimes the OR schedule or the surgeon's schedule prevents [place] from making those accommodations.  If you have not heard from our office [phone]) in 5 working days, call the office and ask for your surgeon's nurse.  If you have other questions about your diagnosis, plan, or surgery, call the office and ask for your surgeon's nurse.    Signed by Stark Klein, MD (10/20/2016 1:43 PM)

## 2016-11-09 ENCOUNTER — Inpatient Hospital Stay (HOSPITAL_COMMUNITY): Payer: BLUE CROSS/BLUE SHIELD | Admitting: Emergency Medicine

## 2016-11-09 ENCOUNTER — Inpatient Hospital Stay (HOSPITAL_COMMUNITY)
Admission: RE | Admit: 2016-11-09 | Discharge: 2016-11-12 | DRG: 331 | Disposition: A | Discharge status: Home / Self Care | Payer: BLUE CROSS/BLUE SHIELD | Source: Ambulatory Visit | Attending: General Surgery | Admitting: General Surgery

## 2016-11-09 ENCOUNTER — Inpatient Hospital Stay (HOSPITAL_COMMUNITY): Payer: BLUE CROSS/BLUE SHIELD | Admitting: Certified Registered"

## 2016-11-09 ENCOUNTER — Encounter (HOSPITAL_COMMUNITY): Payer: Self-pay | Admitting: *Deleted

## 2016-11-09 ENCOUNTER — Encounter (HOSPITAL_COMMUNITY)
Admission: RE | Disposition: A | Discharge status: Home / Self Care | Payer: Self-pay | Source: Ambulatory Visit | Attending: General Surgery

## 2016-11-09 DIAGNOSIS — Z803 Family history of malignant neoplasm of breast: Secondary | ICD-10-CM

## 2016-11-09 DIAGNOSIS — Z800 Family history of malignant neoplasm of digestive organs: Secondary | ICD-10-CM

## 2016-11-09 DIAGNOSIS — I10 Essential (primary) hypertension: Secondary | ICD-10-CM | POA: Diagnosis present

## 2016-11-09 DIAGNOSIS — C187 Malignant neoplasm of sigmoid colon: Secondary | ICD-10-CM | POA: Diagnosis present

## 2016-11-09 DIAGNOSIS — E876 Hypokalemia: Secondary | ICD-10-CM | POA: Diagnosis present

## 2016-11-09 DIAGNOSIS — Z8371 Family history of colonic polyps: Secondary | ICD-10-CM | POA: Diagnosis not present

## 2016-11-09 DIAGNOSIS — K219 Gastro-esophageal reflux disease without esophagitis: Secondary | ICD-10-CM | POA: Diagnosis present

## 2016-11-09 DIAGNOSIS — M1990 Unspecified osteoarthritis, unspecified site: Secondary | ICD-10-CM | POA: Diagnosis present

## 2016-11-09 DIAGNOSIS — Z818 Family history of other mental and behavioral disorders: Secondary | ICD-10-CM

## 2016-11-09 DIAGNOSIS — Z8249 Family history of ischemic heart disease and other diseases of the circulatory system: Secondary | ICD-10-CM

## 2016-11-09 HISTORY — DX: Malignant (primary) neoplasm, unspecified: C80.1

## 2016-11-09 HISTORY — PX: PARTIAL COLECTOMY: SHX5273

## 2016-11-09 HISTORY — PX: LAPAROSCOPIC SIGMOID COLECTOMY: SHX5928

## 2016-11-09 LAB — CBC
HEMATOCRIT: 41.9 % (ref 36.0–46.0)
HEMOGLOBIN: 14.3 g/dL (ref 12.0–15.0)
MCH: 28.4 pg (ref 26.0–34.0)
MCHC: 34.1 g/dL (ref 30.0–36.0)
MCV: 83.1 fL (ref 78.0–100.0)
Platelets: 206 10*3/uL (ref 150–400)
RBC: 5.04 MIL/uL (ref 3.87–5.11)
RDW: 13.1 % (ref 11.5–15.5)
WBC: 17.4 10*3/uL — AB (ref 4.0–10.5)

## 2016-11-09 LAB — CREATININE, SERUM: Creatinine, Ser: 0.91 mg/dL (ref 0.44–1.00)

## 2016-11-09 SURGERY — COLECTOMY, SIGMOID, LAPAROSCOPIC
Anesthesia: General

## 2016-11-09 MED ORDER — KETOROLAC TROMETHAMINE 30 MG/ML IJ SOLN
INTRAMUSCULAR | Status: AC
  Administered 2016-11-09: 30 mg via INTRAVENOUS
  Filled 2016-11-09: qty 1

## 2016-11-09 MED ORDER — KCL IN DEXTROSE-NACL 20-5-0.45 MEQ/L-%-% IV SOLN
INTRAVENOUS | Status: DC
  Administered 2016-11-09 – 2016-11-11 (×4): via INTRAVENOUS
  Filled 2016-11-09 (×4): qty 1000

## 2016-11-09 MED ORDER — ESTROGENS CONJUGATED 0.625 MG PO TABS
0.6250 mg | ORAL_TABLET | Freq: Every day | ORAL | Status: DC
  Administered 2016-11-10 – 2016-11-12 (×3): 0.625 mg via ORAL
  Filled 2016-11-09 (×3): qty 1

## 2016-11-09 MED ORDER — ACETAMINOPHEN 500 MG PO TABS
1000.0000 mg | ORAL_TABLET | ORAL | Status: DC
  Filled 2016-11-09: qty 2

## 2016-11-09 MED ORDER — LACTATED RINGERS IV SOLN
INTRAVENOUS | Status: DC | PRN
  Administered 2016-11-09 (×3): via INTRAVENOUS

## 2016-11-09 MED ORDER — ALVIMOPAN 12 MG PO CAPS
12.0000 mg | ORAL_CAPSULE | Freq: Two times a day (BID) | ORAL | Status: DC
  Administered 2016-11-10 (×2): 12 mg via ORAL
  Filled 2016-11-09 (×3): qty 1

## 2016-11-09 MED ORDER — FENTANYL CITRATE (PF) 250 MCG/5ML IJ SOLN
INTRAMUSCULAR | Status: DC | PRN
  Administered 2016-11-09: 50 ug via INTRAVENOUS
  Administered 2016-11-09: 100 ug via INTRAVENOUS
  Administered 2016-11-09: 50 ug via INTRAVENOUS

## 2016-11-09 MED ORDER — LIDOCAINE HCL (PF) 1 % IJ SOLN
INTRAMUSCULAR | Status: AC
  Filled 2016-11-09: qty 30

## 2016-11-09 MED ORDER — FENTANYL CITRATE (PF) 100 MCG/2ML IJ SOLN: 12.5000 ug | INTRAMUSCULAR | Status: DC | PRN

## 2016-11-09 MED ORDER — SUGAMMADEX SODIUM 200 MG/2ML IV SOLN
INTRAVENOUS | Status: AC
  Filled 2016-11-09: qty 2

## 2016-11-09 MED ORDER — BUPIVACAINE 0.5 % ON-Q PUMP DUAL CATH 300 ML
300.0000 mL | INJECTION | Status: DC
  Filled 2016-11-09: qty 300

## 2016-11-09 MED ORDER — HYDROCHLOROTHIAZIDE 25 MG PO TABS
25.0000 mg | ORAL_TABLET | Freq: Every day | ORAL | Status: DC
  Administered 2016-11-09 – 2016-11-12 (×4): 25 mg via ORAL
  Filled 2016-11-09 (×4): qty 1

## 2016-11-09 MED ORDER — CHLORHEXIDINE GLUCONATE CLOTH 2 % EX PADS: 6.0000 | MEDICATED_PAD | Freq: Once | CUTANEOUS | Status: DC

## 2016-11-09 MED ORDER — BUPIVACAINE ON-Q PAIN PUMP (FOR ORDER SET NO CHG)
INJECTION | Status: DC
  Filled 2016-11-09: qty 1

## 2016-11-09 MED ORDER — HYDROCORTISONE 1 % EX CREA
TOPICAL_CREAM | Freq: Four times a day (QID) | CUTANEOUS | Status: DC
  Filled 2016-11-09: qty 28

## 2016-11-09 MED ORDER — DEXAMETHASONE SODIUM PHOSPHATE 10 MG/ML IJ SOLN
INTRAMUSCULAR | Status: DC | PRN
  Administered 2016-11-09: 10 mg via INTRAVENOUS

## 2016-11-09 MED ORDER — KETAMINE HCL-SODIUM CHLORIDE 100-0.9 MG/10ML-% IV SOSY
PREFILLED_SYRINGE | INTRAVENOUS | Status: AC
  Filled 2016-11-09: qty 10

## 2016-11-09 MED ORDER — ALVIMOPAN 12 MG PO CAPS
12.0000 mg | ORAL_CAPSULE | ORAL | Status: AC
  Administered 2016-11-09: 12 mg via ORAL
  Filled 2016-11-09: qty 1

## 2016-11-09 MED ORDER — ONDANSETRON HCL 4 MG/2ML IJ SOLN
INTRAMUSCULAR | Status: AC
  Filled 2016-11-09: qty 2

## 2016-11-09 MED ORDER — SCOPOLAMINE 1 MG/3DAYS TD PT72
1.0000 | MEDICATED_PATCH | TRANSDERMAL | Status: DC
  Administered 2016-11-09: 1.5 mg via TRANSDERMAL
  Filled 2016-11-09: qty 1

## 2016-11-09 MED ORDER — CELECOXIB 200 MG PO CAPS
200.0000 mg | ORAL_CAPSULE | Freq: Two times a day (BID) | ORAL | Status: DC
  Administered 2016-11-11 – 2016-11-12 (×3): 200 mg via ORAL
  Filled 2016-11-09 (×3): qty 1

## 2016-11-09 MED ORDER — LIDOCAINE HCL 1 % IJ SOLN
INTRAMUSCULAR | Status: DC | PRN
  Administered 2016-11-09: 4.5 mL

## 2016-11-09 MED ORDER — HYDROMORPHONE HCL 1 MG/ML IJ SOLN
INTRAMUSCULAR | Status: AC
  Administered 2016-11-09: 0.5 mg via INTRAVENOUS
  Filled 2016-11-09: qty 1

## 2016-11-09 MED ORDER — KETOROLAC TROMETHAMINE 30 MG/ML IJ SOLN
30.0000 mg | Freq: Four times a day (QID) | INTRAMUSCULAR | Status: AC
  Administered 2016-11-09 – 2016-11-11 (×8): 30 mg via INTRAVENOUS
  Filled 2016-11-09 (×7): qty 1

## 2016-11-09 MED ORDER — DEXTROSE 5 % IV SOLN
2.0000 g | Freq: Two times a day (BID) | INTRAVENOUS | Status: AC
  Administered 2016-11-09: 2 g via INTRAVENOUS
  Filled 2016-11-09: qty 2

## 2016-11-09 MED ORDER — CELECOXIB 200 MG PO CAPS
400.0000 mg | ORAL_CAPSULE | ORAL | Status: AC
  Administered 2016-11-09: 400 mg via ORAL
  Filled 2016-11-09: qty 2

## 2016-11-09 MED ORDER — HYDROMORPHONE HCL 1 MG/ML IJ SOLN
0.2500 mg | INTRAMUSCULAR | Status: DC | PRN
  Administered 2016-11-09 (×2): 0.5 mg via INTRAVENOUS

## 2016-11-09 MED ORDER — ONDANSETRON HCL 4 MG/2ML IJ SOLN
INTRAMUSCULAR | Status: DC | PRN
  Administered 2016-11-09: 4 mg via INTRAVENOUS

## 2016-11-09 MED ORDER — MIDAZOLAM HCL 5 MG/5ML IJ SOLN
INTRAMUSCULAR | Status: DC | PRN
  Administered 2016-11-09: 2 mg via INTRAVENOUS

## 2016-11-09 MED ORDER — DIPHENHYDRAMINE HCL 12.5 MG/5ML PO ELIX: 12.5000 mg | ORAL_SOLUTION | Freq: Four times a day (QID) | ORAL | Status: DC | PRN

## 2016-11-09 MED ORDER — TRAMADOL HCL 50 MG PO TABS
ORAL_TABLET | ORAL | Status: AC
  Administered 2016-11-09: 100 mg via ORAL
  Filled 2016-11-09: qty 2

## 2016-11-09 MED ORDER — DIPHENHYDRAMINE HCL 50 MG/ML IJ SOLN: 12.5000 mg | Freq: Four times a day (QID) | INTRAMUSCULAR | Status: DC | PRN

## 2016-11-09 MED ORDER — ONDANSETRON HCL 4 MG/2ML IJ SOLN: 4.0000 mg | Freq: Four times a day (QID) | INTRAMUSCULAR | Status: DC | PRN

## 2016-11-09 MED ORDER — PROPOFOL 10 MG/ML IV BOLUS
INTRAVENOUS | Status: DC | PRN
  Administered 2016-11-09: 140 mg via INTRAVENOUS

## 2016-11-09 MED ORDER — KETAMINE HCL 10 MG/ML IJ SOLN
INTRAMUSCULAR | Status: DC | PRN
  Administered 2016-11-09: 10 mg via INTRAVENOUS
  Administered 2016-11-09: 30 mg via INTRAVENOUS

## 2016-11-09 MED ORDER — ROCURONIUM BROMIDE 10 MG/ML (PF) SYRINGE
PREFILLED_SYRINGE | INTRAVENOUS | Status: AC
  Filled 2016-11-09: qty 5

## 2016-11-09 MED ORDER — SUGAMMADEX SODIUM 200 MG/2ML IV SOLN
INTRAVENOUS | Status: DC | PRN
  Administered 2016-11-09: 155.2 mg via INTRAVENOUS

## 2016-11-09 MED ORDER — DEXAMETHASONE SODIUM PHOSPHATE 10 MG/ML IJ SOLN
INTRAMUSCULAR | Status: AC
  Filled 2016-11-09: qty 1

## 2016-11-09 MED ORDER — ENOXAPARIN SODIUM 40 MG/0.4ML ~~LOC~~ SOLN
40.0000 mg | SUBCUTANEOUS | Status: DC
  Administered 2016-11-10 – 2016-11-12 (×3): 40 mg via SUBCUTANEOUS
  Filled 2016-11-09 (×3): qty 0.4

## 2016-11-09 MED ORDER — LIDOCAINE 2% (20 MG/ML) 5 ML SYRINGE
INTRAMUSCULAR | Status: DC | PRN
  Administered 2016-11-09: 60 mg via INTRAVENOUS

## 2016-11-09 MED ORDER — METHOCARBAMOL 500 MG PO TABS: 500.0000 mg | ORAL_TABLET | Freq: Four times a day (QID) | ORAL | Status: DC | PRN

## 2016-11-09 MED ORDER — GABAPENTIN 300 MG PO CAPS
300.0000 mg | ORAL_CAPSULE | Freq: Two times a day (BID) | ORAL | Status: DC
  Administered 2016-11-09 – 2016-11-12 (×7): 300 mg via ORAL
  Filled 2016-11-09 (×7): qty 1

## 2016-11-09 MED ORDER — PROPOFOL 10 MG/ML IV BOLUS
INTRAVENOUS | Status: AC
  Filled 2016-11-09: qty 20

## 2016-11-09 MED ORDER — FENTANYL CITRATE (PF) 250 MCG/5ML IJ SOLN
INTRAMUSCULAR | Status: AC
  Filled 2016-11-09: qty 5

## 2016-11-09 MED ORDER — ROCURONIUM BROMIDE 10 MG/ML (PF) SYRINGE
PREFILLED_SYRINGE | INTRAVENOUS | Status: DC | PRN
  Administered 2016-11-09: 60 mg via INTRAVENOUS
  Administered 2016-11-09 (×2): 20 mg via INTRAVENOUS

## 2016-11-09 MED ORDER — GABAPENTIN 300 MG PO CAPS
300.0000 mg | ORAL_CAPSULE | ORAL | Status: AC
  Administered 2016-11-09: 300 mg via ORAL
  Filled 2016-11-09: qty 1

## 2016-11-09 MED ORDER — ACETAMINOPHEN 10 MG/ML IV SOLN
INTRAVENOUS | Status: AC
  Filled 2016-11-09: qty 100

## 2016-11-09 MED ORDER — PHENYLEPHRINE 40 MCG/ML (10ML) SYRINGE FOR IV PUSH (FOR BLOOD PRESSURE SUPPORT)
PREFILLED_SYRINGE | INTRAVENOUS | Status: AC
  Filled 2016-11-09: qty 10

## 2016-11-09 MED ORDER — ACETAMINOPHEN 10 MG/ML IV SOLN
INTRAVENOUS | Status: DC | PRN
  Administered 2016-11-09: 1000 mg via INTRAVENOUS

## 2016-11-09 MED ORDER — BUPIVACAINE 0.5 % ON-Q PUMP DUAL CATH 300 ML
INJECTION | Status: AC | PRN
  Administered 2016-11-09: 300 mL

## 2016-11-09 MED ORDER — CEFOTETAN DISODIUM-DEXTROSE 2-2.08 GM-% IV SOLR
2.0000 g | INTRAVENOUS | Status: AC
  Administered 2016-11-09: 2 g via INTRAVENOUS
  Filled 2016-11-09: qty 50

## 2016-11-09 MED ORDER — ONDANSETRON HCL 4 MG PO TABS: 4.0000 mg | ORAL_TABLET | Freq: Four times a day (QID) | ORAL | Status: DC | PRN

## 2016-11-09 MED ORDER — PHENYLEPHRINE 40 MCG/ML (10ML) SYRINGE FOR IV PUSH (FOR BLOOD PRESSURE SUPPORT)
PREFILLED_SYRINGE | INTRAVENOUS | Status: DC | PRN
  Administered 2016-11-09 (×3): 80 ug via INTRAVENOUS

## 2016-11-09 MED ORDER — TRAMADOL HCL 50 MG PO TABS
50.0000 mg | ORAL_TABLET | Freq: Four times a day (QID) | ORAL | Status: DC | PRN
  Administered 2016-11-09: 100 mg via ORAL
  Filled 2016-11-09: qty 2

## 2016-11-09 MED ORDER — MEDROXYPROGESTERONE ACETATE 2.5 MG PO TABS
2.5000 mg | ORAL_TABLET | Freq: Every day | ORAL | Status: DC
  Administered 2016-11-10 – 2016-11-12 (×3): 2.5 mg via ORAL
  Filled 2016-11-09 (×3): qty 1

## 2016-11-09 MED ORDER — LIDOCAINE 2% (20 MG/ML) 5 ML SYRINGE
INTRAMUSCULAR | Status: AC
  Filled 2016-11-09: qty 5

## 2016-11-09 MED ORDER — METOPROLOL TARTRATE 5 MG/5ML IV SOLN: 5.0000 mg | Freq: Four times a day (QID) | INTRAVENOUS | Status: DC | PRN

## 2016-11-09 MED ORDER — ALUM & MAG HYDROXIDE-SIMETH 200-200-20 MG/5ML PO SUSP: 30.0000 mL | Freq: Four times a day (QID) | ORAL | Status: DC | PRN

## 2016-11-09 MED ORDER — ACETAMINOPHEN 325 MG PO TABS: 650.0000 mg | ORAL_TABLET | Freq: Four times a day (QID) | ORAL | Status: DC | PRN

## 2016-11-09 MED ORDER — BUPIVACAINE-EPINEPHRINE (PF) 0.25% -1:200000 IJ SOLN
INTRAMUSCULAR | Status: AC
  Filled 2016-11-09: qty 30

## 2016-11-09 MED ORDER — SODIUM CHLORIDE 0.9 % IR SOLN
Status: DC | PRN
  Administered 2016-11-09: 1000 mL

## 2016-11-09 MED ORDER — MIDAZOLAM HCL 2 MG/2ML IJ SOLN
INTRAMUSCULAR | Status: AC
  Filled 2016-11-09: qty 2

## 2016-11-09 MED ORDER — CONJ ESTROG-MEDROXYPROGEST ACE 0.625-2.5 MG PO TABS: 1.0000 | ORAL_TABLET | Freq: Every day | ORAL | Status: DC

## 2016-11-09 MED ORDER — ZOLPIDEM TARTRATE 5 MG PO TABS: 5.0000 mg | ORAL_TABLET | Freq: Every evening | ORAL | Status: DC | PRN

## 2016-11-09 MED ORDER — POTASSIUM CHLORIDE ER 10 MEQ PO TBCR
10.0000 meq | EXTENDED_RELEASE_TABLET | Freq: Every day | ORAL | Status: DC
  Administered 2016-11-09: 10 meq via ORAL
  Filled 2016-11-09 (×3): qty 1

## 2016-11-09 SURGICAL SUPPLY — 96 items
ADH SKN CLS APL DERMABOND .7 (GAUZE/BANDAGES/DRESSINGS) ×1
APPLIER CLIP ROT 10 11.4 M/L (STAPLE)
APR CLP MED LRG 11.4X10 (STAPLE)
BLADE CLIPPER SURG (BLADE) ×2 IMPLANT
CANISTER SUCT 3000ML PPV (MISCELLANEOUS) ×3 IMPLANT
CATH KIT ON-Q SILVERSOAK 5 (CATHETERS) IMPLANT
CATH KIT ON-Q SILVERSOAK 5IN (CATHETERS) ×6 IMPLANT
CELLS DAT CNTRL 66122 CELL SVR (MISCELLANEOUS) IMPLANT
CHLORAPREP W/TINT 26ML (MISCELLANEOUS) ×3 IMPLANT
CLIP APPLIE ROT 10 11.4 M/L (STAPLE) IMPLANT
CLOSURE STERI-STRIP 1/2X4 (GAUZE/BANDAGES/DRESSINGS) ×1
CLSR STERI-STRIP ANTIMIC 1/2X4 (GAUZE/BANDAGES/DRESSINGS) ×1 IMPLANT
COVER MAYO STAND STRL (DRAPES) ×6 IMPLANT
COVER SURGICAL LIGHT HANDLE (MISCELLANEOUS) ×6 IMPLANT
DERMABOND ADVANCED (GAUZE/BANDAGES/DRESSINGS) ×2
DERMABOND ADVANCED .7 DNX12 (GAUZE/BANDAGES/DRESSINGS) IMPLANT
DRAPE HALF SHEET 40X57 (DRAPES) ×3 IMPLANT
DRAPE UTILITY XL STRL (DRAPES) ×15 IMPLANT
DRAPE WARM FLUID 44X44 (DRAPE) ×3 IMPLANT
DRSG OPSITE POSTOP 4X10 (GAUZE/BANDAGES/DRESSINGS) IMPLANT
DRSG OPSITE POSTOP 4X6 (GAUZE/BANDAGES/DRESSINGS) ×2 IMPLANT
DRSG OPSITE POSTOP 4X8 (GAUZE/BANDAGES/DRESSINGS) IMPLANT
ELECT BLADE 6.5 EXT (BLADE) ×3 IMPLANT
ELECT CAUTERY BLADE 6.4 (BLADE) ×6 IMPLANT
ELECT REM PT RETURN 9FT ADLT (ELECTROSURGICAL) ×3
ELECTRODE REM PT RTRN 9FT ADLT (ELECTROSURGICAL) ×1 IMPLANT
GEL ULTRASOUND 20GR AQUASONIC (MISCELLANEOUS) IMPLANT
GLOVE BIO SURGEON STRL SZ 6 (GLOVE) ×6 IMPLANT
GLOVE BIOGEL PI IND STRL 6.5 (GLOVE) ×2 IMPLANT
GLOVE BIOGEL PI IND STRL 7.0 (GLOVE) IMPLANT
GLOVE BIOGEL PI IND STRL 7.5 (GLOVE) IMPLANT
GLOVE BIOGEL PI INDICATOR 6.5 (GLOVE) ×4
GLOVE BIOGEL PI INDICATOR 7.0 (GLOVE) ×2
GLOVE BIOGEL PI INDICATOR 7.5 (GLOVE) ×2
GLOVE ECLIPSE 6.5 STRL STRAW (GLOVE) ×2 IMPLANT
GLOVE ECLIPSE 7.5 STRL STRAW (GLOVE) ×2 IMPLANT
GLOVE INDICATOR 6.5 STRL GRN (GLOVE) ×2 IMPLANT
GOWN STRL REUS W/ TWL LRG LVL3 (GOWN DISPOSABLE) ×6 IMPLANT
GOWN STRL REUS W/TWL 2XL LVL3 (GOWN DISPOSABLE) ×6 IMPLANT
GOWN STRL REUS W/TWL LRG LVL3 (GOWN DISPOSABLE) ×18
KIT BASIN OR (CUSTOM PROCEDURE TRAY) ×3 IMPLANT
KIT ROOM TURNOVER OR (KITS) ×3 IMPLANT
L-HOOK LAP DISP 36CM (ELECTROSURGICAL) ×3
LEGGING LITHOTOMY PAIR STRL (DRAPES) ×3 IMPLANT
LHOOK LAP DISP 36CM (ELECTROSURGICAL) ×1 IMPLANT
LIGASURE IMPACT 36 18CM CVD LR (INSTRUMENTS) IMPLANT
LIGASURE MARYLAND LAP STAND (ELECTROSURGICAL) IMPLANT
NS IRRIG 1000ML POUR BTL (IV SOLUTION) ×6 IMPLANT
PAD ARMBOARD 7.5X6 YLW CONV (MISCELLANEOUS) ×6 IMPLANT
PENCIL BUTTON HOLSTER BLD 10FT (ELECTRODE) ×9 IMPLANT
RELOAD PROXIMATE 75MM BLUE (ENDOMECHANICALS) ×3 IMPLANT
RELOAD STAPLE 75 3.8 BLU REG (ENDOMECHANICALS) IMPLANT
RETRACTOR WND ALEXIS 18 MED (MISCELLANEOUS) IMPLANT
RTRCTR WOUND ALEXIS 18CM MED (MISCELLANEOUS)
SCISSORS LAP 5X35 DISP (ENDOMECHANICALS) ×3 IMPLANT
SEALER TISSUE G2 STRG ARTC 35C (ENDOMECHANICALS) ×2 IMPLANT
SET IRRIG TUBING LAPAROSCOPIC (IRRIGATION / IRRIGATOR) IMPLANT
SHEARS HARMONIC ACE PLUS 36CM (ENDOMECHANICALS) IMPLANT
SLEEVE ENDOPATH XCEL 5M (ENDOMECHANICALS) ×7 IMPLANT
SPECIMEN JAR LARGE (MISCELLANEOUS) ×3 IMPLANT
SPONGE LAP 18X18 X RAY DECT (DISPOSABLE) IMPLANT
STAPLER CIRC CVD 29MM 37CM (STAPLE) ×2 IMPLANT
STAPLER PROXIMATE 75MM BLUE (STAPLE) ×2 IMPLANT
STAPLER VISISTAT 35W (STAPLE) ×3 IMPLANT
SUCTION POOLE TIP (SUCTIONS) ×3 IMPLANT
SURGILUBE 2OZ TUBE FLIPTOP (MISCELLANEOUS) ×3 IMPLANT
SUT MNCRL AB 4-0 PS2 18 (SUTURE) ×4 IMPLANT
SUT PDS AB 1 CT  36 (SUTURE)
SUT PDS AB 1 CT 36 (SUTURE) IMPLANT
SUT PDS AB 1 TP1 54 (SUTURE) ×4 IMPLANT
SUT PROLENE 0 CT 1 30 (SUTURE) ×2 IMPLANT
SUT PROLENE 2 0 CT2 30 (SUTURE) IMPLANT
SUT PROLENE 2 0 KS (SUTURE) IMPLANT
SUT VIC AB 2-0 SH 18 (SUTURE) ×3 IMPLANT
SUT VIC AB 3-0 SH 18 (SUTURE) ×3 IMPLANT
SUT VIC AB 3-0 SH 27 (SUTURE) ×6
SUT VIC AB 3-0 SH 27X BRD (SUTURE) IMPLANT
SUT VICRYL AB 2 0 TIES (SUTURE) ×3 IMPLANT
SUT VICRYL AB 3 0 TIES (SUTURE) ×3 IMPLANT
SYR BULB IRRIGATION 50ML (SYRINGE) ×3 IMPLANT
SYS LAPSCP GELPORT 120MM (MISCELLANEOUS) ×3
SYSTEM LAPSCP GELPORT 120MM (MISCELLANEOUS) IMPLANT
TOWEL OR 17X26 10 PK STRL BLUE (TOWEL DISPOSABLE) ×6 IMPLANT
TRAY FOLEY W/METER SILVER 16FR (SET/KITS/TRAYS/PACK) ×3 IMPLANT
TRAY LAPAROSCOPIC MC (CUSTOM PROCEDURE TRAY) ×3 IMPLANT
TRAY PROCTOSCOPIC FIBER OPTIC (SET/KITS/TRAYS/PACK) ×3 IMPLANT
TROCAR XCEL 12X100 BLDLESS (ENDOMECHANICALS) IMPLANT
TROCAR XCEL BLUNT TIP 100MML (ENDOMECHANICALS) IMPLANT
TROCAR XCEL NON-BLD 11X100MML (ENDOMECHANICALS) IMPLANT
TROCAR XCEL NON-BLD 5MMX100MML (ENDOMECHANICALS) ×3 IMPLANT
TUBE CONNECTING 12'X1/4 (SUCTIONS) ×2
TUBE CONNECTING 12X1/4 (SUCTIONS) ×4 IMPLANT
TUBING INSUF HEATED (TUBING) ×3 IMPLANT
TUBING INSUFFLATION (TUBING) ×3 IMPLANT
TUNNELER SHEATH ON-Q 16GX12 DP (PAIN MANAGEMENT) ×2 IMPLANT
YANKAUER SUCT BULB TIP NO VENT (SUCTIONS) ×6 IMPLANT

## 2016-11-09 NOTE — Anesthesia Preprocedure Evaluation (Signed)
Anesthesia Evaluation  Patient identified by MRN, date of birth, ID band Patient awake    Reviewed: Allergy & Precautions, NPO status , Patient's Chart, lab work & pertinent test results  History of Anesthesia Complications Negative for: history of anesthetic complications  Airway Mallampati: II  TM Distance: >3 FB Neck ROM: Full    Dental  (+) Teeth Intact,    Pulmonary neg pulmonary ROS,    breath sounds clear to auscultation       Cardiovascular hypertension, Pt. on medications (-) angina(-) Past MI and (-) CHF (-) dysrhythmias  Rhythm:Regular     Neuro/Psych negative neurological ROS  negative psych ROS   GI/Hepatic Neg liver ROS, GERD  Controlled,adenocarcinoma of the cecum   Endo/Other  negative endocrine ROS  Renal/GU negative Renal ROS     Musculoskeletal  (+) Arthritis ,   Abdominal   Peds  Hematology negative hematology ROS (+)   Anesthesia Other Findings   Reproductive/Obstetrics                             Anesthesia Physical Anesthesia Plan  ASA: II  Anesthesia Plan: General   Post-op Pain Management:    Induction: Intravenous  PONV Risk Score and Plan: 3 and Ondansetron, Dexamethasone and Midazolam  Airway Management Planned: Oral ETT  Additional Equipment: None  Intra-op Plan:   Post-operative Plan: Extubation in OR  Informed Consent: I have reviewed the patients History and Physical, chart, labs and discussed the procedure including the risks, benefits and alternatives for the proposed anesthesia with the patient or authorized representative who has indicated his/her understanding and acceptance.   Dental advisory given  Plan Discussed with: CRNA and Surgeon  Anesthesia Plan Comments:         Anesthesia Quick Evaluation

## 2016-11-09 NOTE — Anesthesia Procedure Notes (Signed)
Procedure Name: Intubation Date/Time: 11/09/2016 7:55 AM Performed by: Freddie Breech Pre-anesthesia Checklist: Patient identified, Emergency Drugs available, Suction available and Patient being monitored Patient Re-evaluated:Patient Re-evaluated prior to induction Oxygen Delivery Method: Circle System Utilized Preoxygenation: Pre-oxygenation with 100% oxygen Induction Type: IV induction and Cricoid Pressure applied Ventilation: Mask ventilation without difficulty Laryngoscope Size: Mac and 3 Grade View: Grade II Tube type: Oral Tube size: 7.5 mm Number of attempts: 1 Airway Equipment and Method: Stylet and Oral airway Placement Confirmation: ETT inserted through vocal cords under direct vision,  positive ETCO2 and breath sounds checked- equal and bilateral Secured at: 21 cm Tube secured with: Tape Dental Injury: Teeth and Oropharynx as per pre-operative assessment

## 2016-11-09 NOTE — Progress Notes (Signed)
Patient was concerned about taking tylenol.  Dose was held due to not sure about allergy.

## 2016-11-09 NOTE — Progress Notes (Signed)
Spoke with Pharmacy about dose of Celebrex states ok for one time dose. (ERAS Protocol)

## 2016-11-09 NOTE — Progress Notes (Signed)
Rash noted to arms, legs and back pt. states Dr Barry Dienes is aware.

## 2016-11-09 NOTE — Interval H&P Note (Signed)
History and Physical Interval Note:  11/09/2016 7:31 AM  Stacy [name]  has presented today for surgery, with the diagnosis of adenocarcinoma of the sigmoid colon. The various methods of treatment have been discussed with the patient and family. After consideration of risks, benefits and other options for treatment, the patient has consented to  Procedure(s): LAPAROSCOPIC SIGMOID COLECTOMY (N/A) as a surgical intervention .  The patient's history has been reviewed, patient examined, no change in status, stable for surgery.  I have reviewed the patient's chart and labs.  Questions were answered to the patient's satisfaction.     [name]

## 2016-11-09 NOTE — Transfer of Care (Signed)
Immediate Anesthesia Transfer of Care Note  Patient: Stacy [name]  Procedure(s) Performed: Procedure(s): LAPAROSCOPIC SIGMOID COLECTOMY (N/A)  Patient Location: PACU  Anesthesia Type:General  Level of Consciousness: drowsy and patient cooperative  Airway & Oxygen Therapy: Patient Spontanous Breathing and Patient connected to face mask oxygen  Post-op Assessment: Report given to RN and Post -op Vital signs reviewed and stable  Post vital signs: Reviewed and stable  Last Vitals:  Vitals:   11/09/16 0638 11/09/16 1057  BP: (!) 169/87   Pulse:    Resp:    Temp:  (!) (P) 36.4 C  SpO2:      Last Pain:  Vitals:   11/09/16 1057  TempSrc:   PainSc: (P) 7       Patients Stated Pain Goal: 4 (25/[id number]/63 [id number])  Complications: No apparent anesthesia complications

## 2016-11-09 NOTE — Op Note (Signed)
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 The risks, benefits, complications, treatment options, and expected outcomes were discussed with the patient. The possibilities of reaction to medication, perforation of viscus, bleeding, recurrent infection, finding a normal colon, the need for additional procedures, failure to diagnose a condition, and creating a complication requiring transfusion or operation were discussed with the patient. The patient was advised of the risk of ostomy.  The patient concurred with the proposed plan, giving informed consent.   The patient was taken to the operating room, identified, and the procedure verified as laparoscopic sigmoid colectomy. A Time Out was held and the above information confirmed.  The patient was brought to the operating room and placed into low lithotomy position. After induction of a general anesthetic, a Foley catheter was inserted and the abdomen was prepped and draped in standard fashion. The patient was then placed into reverse trendelenburg position and rotated to the right. A 5 mm Optiview trocar was placed at the left costal margin under direct visualization. Pneumoperitoneum was insufflated to a pressure of 15 mm Hg. The laparoscope was introduced.    Exploration revealed a normal omentum, small bowel, peritoneum, liver, and stomach. Two 5-mm trocars were then placed in the RLQ after anesthetizing the skin and peritoneum with Marcaine. There were minimal adhesions.  An additional 5 mm trocar was placed in the LLQ.   The  descending and sigmoid colon was then mobilized with gentle retraction of the colon in a medial direction with mobilization of the peritoneal reflection with cautery and the EnSeal.  The tattoo was easily visible.  Mobilization of this area was complete to expose the retroperitoneum. The left colon was able to mobilize downward. The left ureter was identified and traced out.      After completing mobilization, the lower midline was opened 6-7 cm with a #10 blade for the colon extraction port.  The colon was divided at the distal sigmoid with a GIA 75 mm stapler.  The mesocolon was divided with the Enseal, going high on the vessels to maximize lymph node retrieval.  The distal descending colon was divided with the cautery.  The sizers were used to identify the 29 mm EEA as the appropriate stapler.  The sites of division were soft and uninflamed.  The specimen was submitted to pathology.      An end-to-end anastomosis was performed with the EEA stapling device.  A 0-0 prolene pursestring suture was placed around the anvil in the proximal colon.  The length of the colon was good with no tension.  The sizers were used to determine the appropriate angle for the stapler.  The stapler was placed via the anus, the spike deployed, and the stapler coupled without difficulty.  The vagina was checked to make sure the stapler was in the correct cavity.   The colon ends were pulled together with the stapler.  Pressure was held for 1 minute, the vagina was rechecked to make sure the stapler did not incorporate the posterior vaginal wall.  The stapler was fired and pressure held for another 30 seconds.  The stapler was removed.  The anastamotic rings were robust  and complete.  The proctoscope was used to insufflate the rectum to test the anastamosis.  No leak was seen.  The anastamosis was at approximately 15 cm.  No bleeding was seen.    The colon protocol was followed.  The abdomen was irrigated with antibiotic  irrigation.  The fascia was closed with running #1 looped PDS suture.  The skin was irrigated.  OnQ catheters were placed in the preperitoneal space.  The skin of the lower midline extraction port was closed with staples.  The port sites were closed with 4-0 monocryl interrupted sutures and dermabond.  The midline wound was dressed with a honeycomb dressing.    Instrument, sponge, and needle counts were correct prior to abdominal closure and at the conclusion of the case.   Findings: inflamed distal sigmoid adhesed to itself.  Evidence of prior abscess seen at aortic bifurcation.    Estimated Blood Loss: less than 100 mL             Specimens: Sigmoid colon with anastamotic rings       Complications: None; patient tolerated the procedure well.         Disposition: PACU - hemodynamically stable.         Condition: stable

## 2016-11-10 ENCOUNTER — Encounter: Payer: Self-pay | Admitting: Hematology

## 2016-11-10 ENCOUNTER — Encounter (HOSPITAL_COMMUNITY): Payer: Self-pay | Admitting: General Surgery

## 2016-11-10 ENCOUNTER — Telehealth: Payer: Self-pay | Admitting: Hematology

## 2016-11-10 LAB — CBC
HEMATOCRIT: 41.9 % (ref 36.0–46.0)
Hemoglobin: 13.8 g/dL (ref 12.0–15.0)
MCH: 27.9 pg (ref 26.0–34.0)
MCHC: 32.9 g/dL (ref 30.0–36.0)
MCV: 84.6 fL (ref 78.0–100.0)
Platelets: 203 10*3/uL (ref 150–400)
RBC: 4.95 MIL/uL (ref 3.87–5.11)
RDW: 13.6 % (ref 11.5–15.5)
WBC: 16.5 10*3/uL — AB (ref 4.0–10.5)

## 2016-11-10 LAB — BASIC METABOLIC PANEL
ANION GAP: 6 (ref 5–15)
BUN: 6 mg/dL (ref 6–20)
CO2: 26 mmol/L (ref 22–32)
Calcium: 8.1 mg/dL — ABNORMAL LOW (ref 8.9–10.3)
Chloride: 104 mmol/L (ref 101–111)
Creatinine, Ser: 1.11 mg/dL — ABNORMAL HIGH (ref 0.44–1.00)
GFR, EST NON AFRICAN AMERICAN: 56 mL/min — AB (ref >60)
Glucose, Bld: 104 mg/dL — ABNORMAL HIGH (ref 65–99)
POTASSIUM: 3.4 mmol/L — AB (ref 3.5–5.1)
SODIUM: 136 mmol/L (ref 135–145)

## 2016-11-10 MED ORDER — POTASSIUM CHLORIDE CRYS ER 20 MEQ PO TBCR
20.0000 meq | EXTENDED_RELEASE_TABLET | Freq: Every day | ORAL | Status: DC
  Administered 2016-11-10 – 2016-11-12 (×3): 20 meq via ORAL
  Filled 2016-11-10 (×3): qty 1

## 2016-11-10 NOTE — Telephone Encounter (Signed)
Appt has been scheduled for the pt to see Dr. Burr Medico on 9/11 at 230pm. Left the appt date and time on the pt's vm. Letter mailed.

## 2016-11-10 NOTE — Anesthesia Postprocedure Evaluation (Signed)
Anesthesia Post Note  Patient: Stacy [name]  Procedure(s) Performed: Procedure(s) (LRB): LAPAROSCOPIC SIGMOID COLECTOMY (N/A)     Patient location during evaluation: PACU Anesthesia Type: General Level of consciousness: awake and alert Pain management: pain level controlled Vital Signs Assessment: post-procedure vital signs reviewed and stable Respiratory status: spontaneous breathing, nonlabored ventilation, respiratory function stable and patient connected to nasal cannula oxygen Cardiovascular status: blood pressure returned to baseline and stable Postop Assessment: no signs of nausea or vomiting Anesthetic complications: no    Last Vitals:  Vitals:   11/10/16 0951 11/10/16 1256  BP: (!) 157/77 (!) 163/77  Pulse: 80 84  Resp: 20   Temp: 36.7 C 37.2 C  SpO2: 98% 94%    Last Pain:  Vitals:   11/10/16 1256  TempSrc: Oral  PainSc:                  Giles Currie

## 2016-11-10 NOTE — Progress Notes (Signed)
1 Day Post-Op   Subjective/Chief Complaint: Pain well controlled. Took tramadol once.  Minimal itching.  Some flatus, some belching.     Objective: Vital signs in last 24 hours: Temp:  [97.5 F (36.4 C)-98.5 F (36.9 C)] 97.9 F (36.6 C) (08/31 0440) Pulse Rate:  [70-85] 74 (08/31 0440) Resp:  [9-18] 16 (08/31 0440) BP: (129-173)/(65-93) 136/65 (08/31 0440) SpO2:  [94 %-100 %] 98 % (08/31 0440) Last BM Date: 11/08/16  Intake/Output from previous day: 08/30 0701 - 08/31 0700 In: 4448.3 [P.O.:980; I.V.:3468.3] Out: 2875 [Urine:2800; Blood:75] Intake/Output this shift: No intake/output data recorded.  General appearance: alert, cooperative, no distress and standing in bathroom Resp: breathing comfortably GI: soft, non tender, minimal distention.  no staining.  OnQ in place. Extremities: extremities normal, atraumatic, no cyanosis or edema  Lab Results:   Recent Labs  11/09/16 1800 11/10/16 0506  WBC 17.4* 16.5*  HGB 14.3 13.8  HCT 41.9 41.9  PLT 206 203   BMET  Recent Labs  11/09/16 1800 11/10/16 0506  NA  --  136  K  --  3.4*  CL  --  104  CO2  --  26  GLUCOSE  --  104*  BUN  --  6  CREATININE 0.91 1.11*  CALCIUM  --  8.1*   PT/INR No results for input(s): LABPROT, INR in the last 72 hours. ABG No results for input(s): PHART, HCO3 in the last 72 hours.  Invalid input(s): PCO2, PO2  Studies/Results: No results found.  Anti-infectives: Anti-infectives    Start     Dose/Rate Route Frequency Ordered Stop   11/09/16 2000  cefoTEtan (CEFOTAN) 2 g in dextrose 5 % 50 mL IVPB     2 g 100 mL/hr over 30 Minutes Intravenous Every 12 hours 11/09/16 1442 11/09/16 2251   11/09/16 0614  cefoTEtan in Dextrose 5% (CEFOTAN) IVPB 2 g     2 g Intravenous On call to O.R. 11/09/16 5916 11/09/16 0804      Assessment/Plan: s/p Procedure(s): LAPAROSCOPIC SIGMOID COLECTOMY (N/A) d/c foley Advance diet decrease IVF  Replete K for hypokalemia   LOS: 1 day     [facility] 11/10/2016

## 2016-11-11 LAB — CBC
HEMATOCRIT: 42.3 % (ref 36.0–46.0)
HEMOGLOBIN: 14.1 g/dL (ref 12.0–15.0)
MCH: 28.3 pg (ref 26.0–34.0)
MCHC: 33.3 g/dL (ref 30.0–36.0)
MCV: 84.8 fL (ref 78.0–100.0)
Platelets: 221 10*3/uL (ref 150–400)
RBC: 4.99 MIL/uL (ref 3.87–5.11)
RDW: 14.2 % (ref 11.5–15.5)
WBC: 10.9 10*3/uL — ABNORMAL HIGH (ref 4.0–10.5)

## 2016-11-11 LAB — BASIC METABOLIC PANEL
ANION GAP: 7 (ref 5–15)
BUN: 7 mg/dL (ref 6–20)
CALCIUM: 8.5 mg/dL — AB (ref 8.9–10.3)
CO2: 26 mmol/L (ref 22–32)
Chloride: 109 mmol/L (ref 101–111)
Creatinine, Ser: 0.97 mg/dL (ref 0.44–1.00)
GFR calc Af Amer: >60 mL/min (ref >60)
GFR calc non Af Amer: >60 mL/min (ref >60)
GLUCOSE: 90 mg/dL (ref 65–99)
Potassium: 3.4 mmol/L — ABNORMAL LOW (ref 3.5–5.1)
Sodium: 142 mmol/L (ref 135–145)

## 2016-11-11 NOTE — Progress Notes (Signed)
2 Days Post-Op   Subjective/Chief Complaint: Had a BM last night and this morning Sore only when she moves Ambulating in halls Tolerating full liquids   Objective: Vital signs in last 24 hours: Temp:  [98 F (36.7 C)-99 F (37.2 C)] 98.6 F (37 C) (09/01 0510) Pulse Rate:  [64-84] 64 (09/01 0510) Resp:  [16-20] 16 (09/01 0510) BP: (144-169)/(73-77) 164/76 (09/01 0510) SpO2:  [94 %-100 %] 99 % (09/01 0510) Last BM Date: 11/10/16  Intake/Output from previous day: 08/31 0701 - 09/01 0700 In: 1500.4 [I.V.:1500.4] Out: 1576 [Urine:1575; Stool:1] Intake/Output this shift: No intake/output data recorded.  General appearance: alert, cooperative and no distress Resp: clear to auscultation bilaterally Cardio: regular rate and rhythm, S1, S2 normal, no murmur, click, rub or gallop GI: soft, active bowel sounds; incisional tenderness; ON-Q pump with some remaining local Incisions c/d/i  Lab Results:   Recent Labs  11/10/16 0506 11/11/16 0448  WBC 16.5* 10.9*  HGB 13.8 14.1  HCT 41.9 42.3  PLT 203 221   BMET  Recent Labs  11/10/16 0506 11/11/16 0448  NA 136 142  K 3.4* 3.4*  CL 104 109  CO2 26 26  GLUCOSE 104* 90  BUN 6 7  CREATININE 1.11* 0.97  CALCIUM 8.1* 8.5*   PT/INR No results for input(s): LABPROT, INR in the last 72 hours. ABG No results for input(s): PHART, HCO3 in the last 72 hours.  Invalid input(s): PCO2, PO2  Studies/Results: No results found.  Anti-infectives: Anti-infectives    Start     Dose/Rate Route Frequency Ordered Stop   11/09/16 2000  cefoTEtan (CEFOTAN) 2 g in dextrose 5 % 50 mL IVPB     2 g 100 mL/hr over 30 Minutes Intravenous Every 12 hours 11/09/16 1442 11/09/16 2251   11/09/16 0614  cefoTEtan in Dextrose 5% (CEFOTAN) IVPB 2 g     2 g Intravenous On call to O.R. 11/09/16 4818 11/09/16 0804      Assessment/Plan: S/p Procedure(s): LAPAROSCOPIC SIGMOID COLECTOMY (N/A) Advance diet Saline lock Probable discharge  tomorrow  LOS: 2 days    Atticus Lemberger K. 11/11/2016

## 2016-11-12 MED ORDER — TRAMADOL HCL 50 MG PO TABS: 50.0000 mg | ORAL_TABLET | Freq: Four times a day (QID) | ORAL | 0 refills | Status: DC | PRN

## 2016-11-12 NOTE — Discharge Instructions (Signed)
[facility], [place] [phone]   ABDOMINAL SURGERY: POST OP INSTRUCTIONS  Always review your discharge instruction sheet given to you by the facility where your surgery was performed.  IF YOU HAVE DISABILITY OR FAMILY LEAVE FORMS, YOU MUST BRING THEM TO THE OFFICE FOR PROCESSING.  PLEASE DO NOT GIVE THEM TO YOUR DOCTOR.  1. A prescription for pain medication may be given to you upon discharge.  Take your pain medication as prescribed, if needed.  If narcotic pain medicine is not needed, then you may take acetaminophen (Tylenol) or ibuprofen (Advil) as needed. 2. Take your usually prescribed medications unless otherwise directed. 3. If you need a refill on your pain medication, please contact your pharmacy. They will contact our office to request authorization.  Prescriptions will not be filled after 5pm or on week-ends. 4. You should follow a light diet the first few days after arrival home, such as soup and crackers, pudding, etc.unless your doctor has advised otherwise. A high-fiber, low fat diet can be resumed as tolerated.   Be sure to include lots of fluids daily. Most patients will experience some swelling and bruising on the chest and neck area.  Ice packs will help.  Swelling and bruising can take several days to resolve 5. Most patients will experience some swelling and bruising in the area of the incision. Ice pack will help. Swelling and bruising can take several days to resolve..  6. It is common to experience some constipation if taking pain medication after surgery.  Increasing fluid intake and taking a stool softener will usually help or prevent this problem from occurring.  A mild laxative (Milk of Magnesia or Miralax) should be taken according to package directions if there are no bowel movements after 48 hours. 7.  If your surgeon used skin glue on the incision, you may shower in 24 hours.  The glue will flake off over the next 2-3 weeks.  Any sutures or staples  will be removed at the office during your follow-up visit. You may find that a light gauze bandage over your incision may keep your staples from being rubbed or pulled. You may shower and replace the bandage daily. 8. ACTIVITIES:  You may resume regular (light) daily activities beginning the next day--such as daily self-care, walking, climbing stairs--gradually increasing activities as tolerated.  You may have sexual intercourse when it is comfortable.  Refrain from any heavy lifting or straining until approved by your doctor. a. You may drive when you no longer are taking prescription pain medication, you can comfortably wear a seatbelt, and you can safely maneuver your car and apply brakes b. Return to Work: ___________________________________ 71. You should see your doctor in the office for a follow-up appointment approximately two weeks after your surgery.  Make sure that you call for this appointment within a day or two after you arrive home to insure a convenient appointment time. OTHER INSTRUCTIONS:  _____________________________________________________________ _____________________________________________________________  WHEN TO CALL YOUR DOCTOR: 1. Fever over 101.0 2. Inability to urinate 3. Nausea and/or vomiting 4. Extreme swelling or bruising 5. Continued bleeding from incision. 6. Increased pain, redness, or drainage from the incision. 7. Difficulty swallowing or breathing 8. Muscle cramping or spasms. 9. Numbness or tingling in hands or feet or around lips.  The clinic staff is available to answer your questions during regular business hours.  Please dont hesitate to call and ask to speak to one of the nurses if you have concerns.  For further questions, please visit [email or web address]

## 2016-11-12 NOTE — Discharge Summary (Signed)
Physician Discharge Summary  Patient ID: Stacy [name] MRN: [id number] DOB/AGE: October 24, 1965 51 y.o.  Admit date: 11/09/2016 Discharge date: 11/12/2016  Admission Diagnoses:  Sigmoid colon cancer  Discharge Diagnoses: same Active Problems:   Cancer of sigmoid colon [facility])   Discharged Condition: good  Hospital Course: Laparoscopic sigmoid colectomy 11/09/16.  The patient did quite well and began having bowel movements on POD #2.  Pain well-controlled.  Voiding well.  Diet advanced and ready for discharge on POD #3  Consults: None    Treatments: surgery: laparoscopic sigmoid colectomy  Discharge Exam: Blood pressure (!) 162/79, pulse 72, temperature 98.6 F (37 C), temperature source Oral, resp. rate 16, height 5' 5.5" (1.664 m), weight 77.6 kg (171 lb), SpO2 99 %. General appearance: alert, cooperative and no distress GI: soft, minimal tenderness; + BS Incisions c/d/i - OnQ pump removed.  Disposition: Discharge home  Discharge Instructions    Call MD for:  persistant nausea and vomiting    Complete by:  As directed    Call MD for:  redness, tenderness, or signs of infection (pain, swelling, redness, odor or green/yellow discharge around incision site)    Complete by:  As directed    Call MD for:  severe uncontrolled pain    Complete by:  As directed    Call MD for:  temperature >100.4    Complete by:  As directed    Diet general    Complete by:  As directed    Driving Restrictions    Complete by:  As directed    Do not drive while taking pain medications   Increase activity slowly    Complete by:  As directed    May shower / Bathe    Complete by:  As directed      Allergies as of 11/12/2016      Reactions   No Known Allergies    Vicodin [hydrocodone-acetaminophen] Rash      Medication List    TAKE these medications   Black Cohosh 40 MG Caps take 2 capsules (80 MG TOTAL) by mouth twice a day   calcium gluconate 500 MG tablet Take 2 tablets by mouth daily.    estrogen (conjugated)-medroxyprogesterone 0.625-2.5 MG tablet Commonly known as:  PREMPRO Take 1 tablet by mouth daily.   GLUCOSAMINE CHOND DOUBLE STR PO Take 2 tablets by mouth daily.   hydrochlorothiazide 25 MG tablet Commonly known as:  HYDRODIURIL Take 25 mg by mouth daily.   potassium chloride 10 MEQ tablet Commonly known as:  K-DUR Take 10 mEq by mouth daily.   traMADol 50 MG tablet Commonly known as:  ULTRAM Take 1-2 tablets (50-100 mg total) by mouth every 6 (six) hours as needed for moderate pain or severe pain.            Discharge Care Instructions        Start     Ordered   11/12/16 0000  traMADol (ULTRAM) 50 MG tablet  Every 6 hours PRN     11/12/16 0949   11/12/16 0000  Diet general     11/12/16 0949   11/12/16 0000  Increase activity slowly     11/12/16 0949   11/12/16 0000  May shower / Bathe     11/12/16 0949   11/12/16 0000  Driving Restrictions    Comments:  Do not drive while taking pain medications   11/12/16 0949   11/12/16 0000  Call MD for:  temperature >100.4     11/12/16 6237  11/12/16 0000  Call MD for:  persistant nausea and vomiting     11/12/16 0949   11/12/16 0000  Call MD for:  severe uncontrolled pain     11/12/16 0949   11/12/16 0000  Call MD for:  redness, tenderness, or signs of infection (pain, swelling, redness, odor or green/yellow discharge around incision site)     11/12/16 0949     Follow-up Information    Stark Klein, MD. Schedule an appointment as soon as possible for a visit in 2 week(s).   Specialty:  General Surgery Contact information: [address] [place] [address] [phone]           Signed: Maia Petties. 11/12/2016, 9:50 AM

## 2016-11-12 NOTE — Progress Notes (Signed)
Patient D/C per MD order.  AVS reviewed with patient.  No further questions.Skin clean, dry, and intact upon D/C with the exception of surgical incision.  D/C per wheelchair and private vehicle home with family.

## 2016-11-15 NOTE — Progress Notes (Signed)
Please let patient know no cancer!

## 2016-11-21 ENCOUNTER — Ambulatory Visit: Payer: Self-pay | Admitting: Hematology

## 2017-01-24 ENCOUNTER — Ambulatory Visit (INDEPENDENT_AMBULATORY_CARE_PROVIDER_SITE_OTHER): Payer: BLUE CROSS/BLUE SHIELD | Admitting: Orthopaedic Surgery

## 2017-01-24 ENCOUNTER — Encounter (INDEPENDENT_AMBULATORY_CARE_PROVIDER_SITE_OTHER): Payer: Self-pay | Admitting: Orthopaedic Surgery

## 2017-01-24 DIAGNOSIS — M1611 Unilateral primary osteoarthritis, right hip: Secondary | ICD-10-CM

## 2017-01-24 NOTE — Progress Notes (Signed)
The patient is a 51 year old well-known to me.  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  Her x-rays show flattening of the femoral head.  There is complete loss of superior lateral joint space.  There is para-articular osteophytes and sclerotic changes.  Again the femoral head is flattened at this point and has evidence that is starting to collapse.  Her pain is daily and is 10 out of 10.  It is detrimentally affecting her activity living, quality of life, and her mobility.  She has had intra-articular steroid injections as well as anti-inflammatories.  She is work on activity modification and using a cane.  She is worked on a home exercise program and physical therapy work on [employer].  Is been worsening over several years now.  We have seen her for a long period time as well.  At this point she does wish to proceed with total hip arthroplasty surgery.  On examination of her left hip her left hip moves smoothly and is normal.  Examination of her right hip shows limitations significantly and severely in internal/external rotation.  There is no muscle block to rotation at this point.  Her hip is incredibly stiff.  She is slightly shorter on the right when comparing the right and the left.  At this point I agree with proceeding with total hip arthroplasty.  I do feel this is medically necessary given the fact that she is failed all forms conservative treatment.  Once again we had a long and thorough discussion about the surgery including showing her x-rays and hip models explaining what the surgery involves from an intraoperative and postoperative course.  We explained in detail the risk and benefit of the surgery.  We will try to get this scheduled before the end of the year.  All questions and concerns were answered and addressed.

## 2017-01-31 ENCOUNTER — Telehealth (INDEPENDENT_AMBULATORY_CARE_PROVIDER_SITE_OTHER): Payer: Self-pay | Admitting: Orthopaedic Surgery

## 2017-01-31 ENCOUNTER — Telehealth: Payer: Self-pay | Admitting: General Practice

## 2017-01-31 DIAGNOSIS — Z01419 Encounter for gynecological examination (general) (routine) without abnormal findings: Secondary | ICD-10-CM

## 2017-01-31 DIAGNOSIS — N951 Menopausal and female climacteric states: Secondary | ICD-10-CM

## 2017-01-31 MED ORDER — CONJ ESTROG-MEDROXYPROGEST ACE 0.625-2.5 MG PO TABS: 1.0000 | ORAL_TABLET | Freq: Every day | ORAL | 8 refills | Status: DC

## 2017-01-31 NOTE — Telephone Encounter (Signed)
I called patient and scheduled surgery. 

## 2017-01-31 NOTE — Telephone Encounter (Signed)
Needs hip replacement before the end of the year. Other medical procedures depending on when the surgery is scheduled. Has left multiple messages- CB [phone]

## 2017-01-31 NOTE — Telephone Encounter (Signed)
Patient called and left message stating she needs a refill on her prempro. Patient states she is almost completely out of pills and needs this refill. Called Dr Ihor Dow and obtained refills for patient. Called patient and informed her of Rx sent to pharmacy. Patient verbalized understanding & had no questions

## 2017-02-23 ENCOUNTER — Telehealth (INDEPENDENT_AMBULATORY_CARE_PROVIDER_SITE_OTHER): Payer: Self-pay | Admitting: Orthopaedic Surgery

## 2017-02-23 NOTE — Telephone Encounter (Signed)
Patients questions answered

## 2017-02-23 NOTE — Telephone Encounter (Signed)
Patient called asked if she can get a call back concerning what should she expect after surgery and if an information sheet can be sent to her with everything she should expect after surgery. Patient asked if there is anything she should do prior to surgery. The number to contact patient is [phone]

## 2017-02-28 ENCOUNTER — Other Ambulatory Visit (INDEPENDENT_AMBULATORY_CARE_PROVIDER_SITE_OTHER): Payer: Self-pay | Admitting: Orthopaedic Surgery

## 2017-02-28 DIAGNOSIS — M1611 Unilateral primary osteoarthritis, right hip: Secondary | ICD-10-CM

## 2017-03-01 ENCOUNTER — Other Ambulatory Visit (HOSPITAL_COMMUNITY): Payer: Self-pay | Admitting: Emergency Medicine

## 2017-03-01 HISTORY — PX: SIGMOIDOSCOPY: SUR1295

## 2017-03-01 NOTE — Progress Notes (Signed)
ekg 11-02-16 epic   Ct chest 10-18-16 epic

## 2017-03-01 NOTE — Patient Instructions (Signed)
Stacy [name]  03/01/2017   Your procedure is scheduled on: 03-08-17  Report to [facility] Main  Entrance    Report to admitting at [place]   Call this number if you have problems the morning of surgery  [phone]   Remember: ONLY 1 PERSON MAY GO WITH YOU TO SHORT STAY TO GET  READY MORNING OF YOUR SURGERY.  Do not eat food or drink liquids :After Midnight.     Take these medicines the morning of surgery with A SIP OF WATER: NONE                                You may not have any metal on your body including hair pins and              piercings  Do not wear jewelry, make-up, lotions, powders or perfumes, deodorant             Do not wear nail polish.  Do not shave  48 hours prior to surgery.                Do not bring valuables to the hospital. [place].  Contacts, dentures or bridgework may not be worn into surgery.  Leave suitcase in the car. After surgery it may be brought to your room.                Please read over the following fact sheets you were given: _____________________________________________________________________             [facility] - Preparing for Surgery Before surgery, you can play an important role.  Because skin is not sterile, your skin needs to be as free of germs as possible.  You can reduce the number of germs on your skin by washing with CHG (chlorahexidine gluconate) soap before surgery.  CHG is an antiseptic cleaner which kills germs and bonds with the skin to continue killing germs even after washing. Please DO NOT use if you have an allergy to CHG or antibacterial soaps.  If your skin becomes reddened/irritated stop using the CHG and inform your nurse when you arrive at Short Stay. Do not shave (including legs and underarms) for at least 48 hours prior to the first CHG shower.  You may shave your face/neck. Please follow these instructions carefully:  1.  Shower  with CHG Soap the night before surgery and the  morning of Surgery.  2.  If you choose to wash your hair, wash your hair first as usual with your  normal  shampoo.  3.  After you shampoo, rinse your hair and body thoroughly to remove the  shampoo.                           4.  Use CHG as you would any other liquid soap.  You can apply chg directly  to the skin and wash                       Gently with a scrungie or clean washcloth.  5.  Apply the CHG Soap to your body ONLY FROM THE NECK DOWN.   Do not use  on face/ open                           Wound or open sores. Avoid contact with eyes, ears mouth and genitals (private parts).                       Wash face,  Genitals (private parts) with your normal soap.             6.  Wash thoroughly, paying special attention to the area where your surgery  will be performed.  7.  Thoroughly rinse your body with warm water from the neck down.  8.  DO NOT shower/wash with your normal soap after using and rinsing off  the CHG Soap.                9.  Pat yourself dry with a clean towel.            10.  Wear clean pajamas.            11.  Place clean sheets on your bed the night of your first shower and do not  sleep with pets. Day of Surgery : Do not apply any lotions/deodorants the morning of surgery.  Please wear clean clothes to the hospital/surgery center.  FAILURE TO FOLLOW THESE INSTRUCTIONS MAY RESULT IN THE CANCELLATION OF YOUR SURGERY PATIENT SIGNATURE_________________________________  NURSE SIGNATURE__________________________________  ________________________________________________________________________

## 2017-03-02 ENCOUNTER — Encounter (HOSPITAL_COMMUNITY): Payer: Self-pay

## 2017-03-02 ENCOUNTER — Encounter (HOSPITAL_COMMUNITY)
Admission: RE | Admit: 2017-03-02 | Discharge: 2017-03-02 | Disposition: A | Discharge status: Home / Self Care | Payer: BLUE CROSS/BLUE SHIELD | Source: Ambulatory Visit | Attending: Orthopaedic Surgery | Admitting: Orthopaedic Surgery

## 2017-03-02 ENCOUNTER — Other Ambulatory Visit: Payer: Self-pay

## 2017-03-02 DIAGNOSIS — Z01810 Encounter for preprocedural cardiovascular examination: Secondary | ICD-10-CM | POA: Diagnosis present

## 2017-03-02 DIAGNOSIS — Z01812 Encounter for preprocedural laboratory examination: Secondary | ICD-10-CM | POA: Insufficient documentation

## 2017-03-02 DIAGNOSIS — I10 Essential (primary) hypertension: Secondary | ICD-10-CM | POA: Diagnosis not present

## 2017-03-02 LAB — BASIC METABOLIC PANEL
ANION GAP: 9 (ref 5–15)
BUN: 13 mg/dL (ref 6–20)
CALCIUM: 9 mg/dL (ref 8.9–10.3)
CO2: 28 mmol/L (ref 22–32)
Chloride: 103 mmol/L (ref 101–111)
Creatinine, Ser: 1.01 mg/dL — ABNORMAL HIGH (ref 0.44–1.00)
GFR calc non Af Amer: >60 mL/min (ref >60)
GLUCOSE: 87 mg/dL (ref 65–99)
POTASSIUM: 3.1 mmol/L — AB (ref 3.5–5.1)
Sodium: 140 mmol/L (ref 135–145)

## 2017-03-02 LAB — ABO/RH: ABO/RH(D): O POS

## 2017-03-02 LAB — CBC
HEMATOCRIT: 43.2 % (ref 36.0–46.0)
HEMOGLOBIN: 14.9 g/dL (ref 12.0–15.0)
MCH: 28.7 pg (ref 26.0–34.0)
MCHC: 34.5 g/dL (ref 30.0–36.0)
MCV: 83.1 fL (ref 78.0–100.0)
Platelets: 203 10*3/uL (ref 150–400)
RBC: 5.2 MIL/uL — AB (ref 3.87–5.11)
RDW: 12.6 % (ref 11.5–15.5)
WBC: 7.3 10*3/uL (ref 4.0–10.5)

## 2017-03-02 LAB — HCG, SERUM, QUALITATIVE: PREG SERUM: NEGATIVE

## 2017-03-02 LAB — SURGICAL PCR SCREEN
MRSA, PCR: NEGATIVE
STAPHYLOCOCCUS AUREUS: NEGATIVE

## 2017-03-08 ENCOUNTER — Other Ambulatory Visit: Payer: Self-pay

## 2017-03-08 ENCOUNTER — Inpatient Hospital Stay (HOSPITAL_COMMUNITY): Payer: BLUE CROSS/BLUE SHIELD | Admitting: Anesthesiology

## 2017-03-08 ENCOUNTER — Inpatient Hospital Stay (HOSPITAL_COMMUNITY): Payer: BLUE CROSS/BLUE SHIELD

## 2017-03-08 ENCOUNTER — Inpatient Hospital Stay (HOSPITAL_COMMUNITY)
Admission: RE | Admit: 2017-03-08 | Discharge: 2017-03-10 | DRG: 470 | Disposition: A | Discharge status: Home Health | Payer: BLUE CROSS/BLUE SHIELD | Source: Ambulatory Visit | Attending: Orthopaedic Surgery | Admitting: Orthopaedic Surgery

## 2017-03-08 ENCOUNTER — Encounter (HOSPITAL_COMMUNITY)
Admission: RE | Disposition: A | Discharge status: Home Health | Payer: Self-pay | Source: Ambulatory Visit | Attending: Orthopaedic Surgery

## 2017-03-08 ENCOUNTER — Encounter (HOSPITAL_COMMUNITY): Payer: Self-pay

## 2017-03-08 DIAGNOSIS — Z885 Allergy status to narcotic agent status: Secondary | ICD-10-CM

## 2017-03-08 DIAGNOSIS — K219 Gastro-esophageal reflux disease without esophagitis: Secondary | ICD-10-CM | POA: Diagnosis present

## 2017-03-08 DIAGNOSIS — M1611 Unilateral primary osteoarthritis, right hip: Principal | ICD-10-CM | POA: Diagnosis present

## 2017-03-08 DIAGNOSIS — Z96641 Presence of right artificial hip joint: Secondary | ICD-10-CM

## 2017-03-08 DIAGNOSIS — I10 Essential (primary) hypertension: Secondary | ICD-10-CM | POA: Diagnosis present

## 2017-03-08 DIAGNOSIS — Z85038 Personal history of other malignant neoplasm of large intestine: Secondary | ICD-10-CM | POA: Diagnosis not present

## 2017-03-08 HISTORY — PX: TOTAL HIP ARTHROPLASTY: SHX124

## 2017-03-08 LAB — TYPE AND SCREEN
ABO/RH(D): O POS
ANTIBODY SCREEN: NEGATIVE

## 2017-03-08 SURGERY — ARTHROPLASTY, HIP, TOTAL, ANTERIOR APPROACH
Anesthesia: Spinal | Laterality: Right

## 2017-03-08 MED ORDER — FENTANYL CITRATE (PF) 100 MCG/2ML IJ SOLN: 25.0000 ug | INTRAMUSCULAR | Status: DC | PRN

## 2017-03-08 MED ORDER — CEFAZOLIN SODIUM-DEXTROSE 1-4 GM/50ML-% IV SOLN
1.0000 g | Freq: Four times a day (QID) | INTRAVENOUS | Status: AC
  Administered 2017-03-08 (×2): 1 g via INTRAVENOUS
  Filled 2017-03-08 (×2): qty 50

## 2017-03-08 MED ORDER — PHENOL 1.4 % MT LIQD: 1.0000 | OROMUCOSAL | Status: DC | PRN

## 2017-03-08 MED ORDER — POLYETHYLENE GLYCOL 3350 17 G PO PACK: 17.0000 g | PACK | Freq: Every day | ORAL | Status: DC | PRN

## 2017-03-08 MED ORDER — METOCLOPRAMIDE HCL 5 MG/ML IJ SOLN: 5.0000 mg | Freq: Three times a day (TID) | INTRAMUSCULAR | Status: DC | PRN

## 2017-03-08 MED ORDER — PROPOFOL 500 MG/50ML IV EMUL
INTRAVENOUS | Status: DC | PRN
  Administered 2017-03-08: 120 ug/kg/min via INTRAVENOUS

## 2017-03-08 MED ORDER — TRAMADOL HCL 50 MG PO TABS
100.0000 mg | ORAL_TABLET | Freq: Four times a day (QID) | ORAL | Status: DC | PRN
  Administered 2017-03-08 – 2017-03-09 (×2): 100 mg via ORAL
  Filled 2017-03-08 (×2): qty 2

## 2017-03-08 MED ORDER — PHENYLEPHRINE 40 MCG/ML (10ML) SYRINGE FOR IV PUSH (FOR BLOOD PRESSURE SUPPORT)
PREFILLED_SYRINGE | INTRAVENOUS | Status: DC | PRN
  Administered 2017-03-08 (×4): 80 ug via INTRAVENOUS

## 2017-03-08 MED ORDER — FENTANYL CITRATE (PF) 100 MCG/2ML IJ SOLN
INTRAMUSCULAR | Status: DC | PRN
  Administered 2017-03-08: 100 ug via INTRAVENOUS

## 2017-03-08 MED ORDER — PHENYLEPHRINE 40 MCG/ML (10ML) SYRINGE FOR IV PUSH (FOR BLOOD PRESSURE SUPPORT)
PREFILLED_SYRINGE | INTRAVENOUS | Status: AC
  Filled 2017-03-08: qty 10

## 2017-03-08 MED ORDER — POTASSIUM CHLORIDE ER 10 MEQ PO TBCR
10.0000 meq | EXTENDED_RELEASE_TABLET | Freq: Every day | ORAL | Status: DC
  Administered 2017-03-08 – 2017-03-10 (×3): 10 meq via ORAL
  Filled 2017-03-08 (×5): qty 1

## 2017-03-08 MED ORDER — ASPIRIN EC 325 MG PO TBEC
325.0000 mg | DELAYED_RELEASE_TABLET | Freq: Every day | ORAL | Status: DC
  Administered 2017-03-09 – 2017-03-10 (×2): 325 mg via ORAL
  Filled 2017-03-08 (×2): qty 1

## 2017-03-08 MED ORDER — MIDAZOLAM HCL 2 MG/2ML IJ SOLN
INTRAMUSCULAR | Status: AC
  Filled 2017-03-08: qty 2

## 2017-03-08 MED ORDER — LACTATED RINGERS IV SOLN: INTRAVENOUS | Status: DC

## 2017-03-08 MED ORDER — ONDANSETRON HCL 4 MG/2ML IJ SOLN
INTRAMUSCULAR | Status: DC | PRN
  Administered 2017-03-08: 4 mg via INTRAVENOUS

## 2017-03-08 MED ORDER — CEFAZOLIN SODIUM-DEXTROSE 2-4 GM/100ML-% IV SOLN
2.0000 g | INTRAVENOUS | Status: AC
  Administered 2017-03-08: 2 g via INTRAVENOUS

## 2017-03-08 MED ORDER — METHOCARBAMOL 500 MG PO TABS: 500.0000 mg | ORAL_TABLET | Freq: Four times a day (QID) | ORAL | Status: DC | PRN

## 2017-03-08 MED ORDER — HYDROMORPHONE HCL 1 MG/ML IJ SOLN
1.0000 mg | INTRAMUSCULAR | Status: DC | PRN
  Filled 2017-03-08: qty 1

## 2017-03-08 MED ORDER — METHOCARBAMOL 1000 MG/10ML IJ SOLN
500.0000 mg | Freq: Four times a day (QID) | INTRAVENOUS | Status: DC | PRN
  Administered 2017-03-08: 500 mg via INTRAVENOUS
  Filled 2017-03-08: qty 550

## 2017-03-08 MED ORDER — PROPOFOL 10 MG/ML IV BOLUS
INTRAVENOUS | Status: DC | PRN
  Administered 2017-03-08: 20 mg via INTRAVENOUS

## 2017-03-08 MED ORDER — ACETAMINOPHEN 325 MG PO TABS: 650.0000 mg | ORAL_TABLET | ORAL | Status: DC | PRN

## 2017-03-08 MED ORDER — DIPHENHYDRAMINE HCL 12.5 MG/5ML PO ELIX: 12.5000 mg | ORAL_SOLUTION | ORAL | Status: DC | PRN

## 2017-03-08 MED ORDER — DEXAMETHASONE SODIUM PHOSPHATE 10 MG/ML IJ SOLN
INTRAMUSCULAR | Status: AC
  Filled 2017-03-08: qty 1

## 2017-03-08 MED ORDER — MIDAZOLAM HCL 5 MG/5ML IJ SOLN
INTRAMUSCULAR | Status: DC | PRN
  Administered 2017-03-08: 2 mg via INTRAVENOUS

## 2017-03-08 MED ORDER — LACTATED RINGERS IV SOLN
INTRAVENOUS | Status: DC
  Administered 2017-03-08: 1000 mL via INTRAVENOUS
  Administered 2017-03-08: 10:00:00 via INTRAVENOUS

## 2017-03-08 MED ORDER — METOCLOPRAMIDE HCL 5 MG PO TABS: 5.0000 mg | ORAL_TABLET | Freq: Three times a day (TID) | ORAL | Status: DC | PRN

## 2017-03-08 MED ORDER — FENTANYL CITRATE (PF) 100 MCG/2ML IJ SOLN
INTRAMUSCULAR | Status: AC
  Filled 2017-03-08: qty 2

## 2017-03-08 MED ORDER — DOCUSATE SODIUM 100 MG PO CAPS
100.0000 mg | ORAL_CAPSULE | Freq: Two times a day (BID) | ORAL | Status: DC
  Administered 2017-03-08 – 2017-03-10 (×4): 100 mg via ORAL
  Filled 2017-03-08 (×4): qty 1

## 2017-03-08 MED ORDER — DEXAMETHASONE SODIUM PHOSPHATE 10 MG/ML IJ SOLN
INTRAMUSCULAR | Status: DC | PRN
  Administered 2017-03-08: 10 mg via INTRAVENOUS

## 2017-03-08 MED ORDER — SODIUM CHLORIDE 0.9 % IR SOLN
Status: DC | PRN
  Administered 2017-03-08: 1000 mL

## 2017-03-08 MED ORDER — CEFAZOLIN SODIUM-DEXTROSE 2-4 GM/100ML-% IV SOLN
INTRAVENOUS | Status: AC
  Filled 2017-03-08: qty 100

## 2017-03-08 MED ORDER — CONJ ESTROG-MEDROXYPROGEST ACE 0.625-2.5 MG PO TABS
1.0000 | ORAL_TABLET | Freq: Every day | ORAL | Status: DC
  Administered 2017-03-08 – 2017-03-10 (×3): 1 via ORAL

## 2017-03-08 MED ORDER — ONDANSETRON HCL 4 MG/2ML IJ SOLN: 4.0000 mg | Freq: Four times a day (QID) | INTRAMUSCULAR | Status: DC | PRN

## 2017-03-08 MED ORDER — ALUM & MAG HYDROXIDE-SIMETH 200-200-20 MG/5ML PO SUSP: 30.0000 mL | ORAL | Status: DC | PRN

## 2017-03-08 MED ORDER — SODIUM CHLORIDE 0.9 % IV SOLN
INTRAVENOUS | Status: DC
  Administered 2017-03-08: 12:00:00 via INTRAVENOUS

## 2017-03-08 MED ORDER — HYDROCODONE-ACETAMINOPHEN 5-325 MG PO TABS: 1.0000 | ORAL_TABLET | ORAL | Status: DC | PRN

## 2017-03-08 MED ORDER — PROPOFOL 10 MG/ML IV BOLUS
INTRAVENOUS | Status: AC
  Filled 2017-03-08: qty 60

## 2017-03-08 MED ORDER — ACETAMINOPHEN 650 MG RE SUPP: 650.0000 mg | RECTAL | Status: DC | PRN

## 2017-03-08 MED ORDER — OXYCODONE HCL 5 MG PO TABS
10.0000 mg | ORAL_TABLET | ORAL | Status: DC | PRN
  Administered 2017-03-08 – 2017-03-10 (×8): 10 mg via ORAL
  Filled 2017-03-08 (×8): qty 2

## 2017-03-08 MED ORDER — MEPERIDINE HCL 50 MG/ML IJ SOLN: 6.2500 mg | INTRAMUSCULAR | Status: DC | PRN

## 2017-03-08 MED ORDER — MENTHOL 3 MG MT LOZG: 1.0000 | LOZENGE | OROMUCOSAL | Status: DC | PRN

## 2017-03-08 MED ORDER — HYDROCHLOROTHIAZIDE 25 MG PO TABS
25.0000 mg | ORAL_TABLET | Freq: Every day | ORAL | Status: DC
  Administered 2017-03-09 – 2017-03-10 (×2): 25 mg via ORAL
  Filled 2017-03-08 (×2): qty 1

## 2017-03-08 MED ORDER — ONDANSETRON HCL 4 MG PO TABS: 4.0000 mg | ORAL_TABLET | Freq: Four times a day (QID) | ORAL | Status: DC | PRN

## 2017-03-08 MED ORDER — ONDANSETRON HCL 4 MG/2ML IJ SOLN
INTRAMUSCULAR | Status: AC
  Filled 2017-03-08: qty 2

## 2017-03-08 MED ORDER — TRANEXAMIC ACID 1000 MG/10ML IV SOLN
1000.0000 mg | INTRAVENOUS | Status: AC
  Administered 2017-03-08: 1000 mg via INTRAVENOUS
  Filled 2017-03-08: qty 1100

## 2017-03-08 MED ORDER — METOCLOPRAMIDE HCL 5 MG/ML IJ SOLN: 10.0000 mg | Freq: Once | INTRAMUSCULAR | Status: DC | PRN

## 2017-03-08 SURGICAL SUPPLY — 38 items
APL SKNCLS STERI-STRIP NONHPOA (GAUZE/BANDAGES/DRESSINGS) ×1
BAG SPEC THK2 15X12 ZIP CLS (MISCELLANEOUS)
BAG ZIPLOCK 12X15 (MISCELLANEOUS) IMPLANT
BENZOIN TINCTURE PRP APPL 2/3 (GAUZE/BANDAGES/DRESSINGS) ×2 IMPLANT
BLADE SAW SGTL 18X1.27X75 (BLADE) ×2 IMPLANT
BLADE SAW SGTL 18X1.27X75MM (BLADE) ×1
CAPT HIP TOTAL 2 ×2 IMPLANT
CLOSURE WOUND 1/2 X4 (GAUZE/BANDAGES/DRESSINGS) ×1
COVER PERINEAL POST (MISCELLANEOUS) ×3 IMPLANT
COVER SURGICAL LIGHT HANDLE (MISCELLANEOUS) ×3 IMPLANT
DRAPE STERI IOBAN 125X83 (DRAPES) ×3 IMPLANT
DRAPE U-SHAPE 47X51 STRL (DRAPES) ×6 IMPLANT
DRESSING AQUACEL AG SP 3.5X10 (GAUZE/BANDAGES/DRESSINGS) IMPLANT
DRSG AQUACEL AG ADV 3.5X10 (GAUZE/BANDAGES/DRESSINGS) ×3 IMPLANT
DRSG AQUACEL AG SP 3.5X10 (GAUZE/BANDAGES/DRESSINGS) ×3
DURAPREP 26ML APPLICATOR (WOUND CARE) ×3 IMPLANT
ELECT REM PT RETURN 15FT ADLT (MISCELLANEOUS) ×3 IMPLANT
GAUZE XEROFORM 1X8 LF (GAUZE/BANDAGES/DRESSINGS) IMPLANT
GLOVE BIO SURGEON STRL SZ7.5 (GLOVE) ×3 IMPLANT
GLOVE BIOGEL PI IND STRL 8 (GLOVE) ×2 IMPLANT
GLOVE BIOGEL PI INDICATOR 8 (GLOVE) ×4
GLOVE ECLIPSE 8.0 STRL XLNG CF (GLOVE) ×3 IMPLANT
GOWN STRL REUS W/TWL XL LVL3 (GOWN DISPOSABLE) ×6 IMPLANT
HANDPIECE INTERPULSE COAX TIP (DISPOSABLE) ×3
HOLDER FOLEY CATH W/STRAP (MISCELLANEOUS) ×3 IMPLANT
PACK ANTERIOR HIP CUSTOM (KITS) ×3 IMPLANT
SET HNDPC FAN SPRY TIP SCT (DISPOSABLE) ×1 IMPLANT
STAPLER VISISTAT 35W (STAPLE) IMPLANT
STRIP CLOSURE SKIN 1/2X4 (GAUZE/BANDAGES/DRESSINGS) ×1 IMPLANT
SUT ETHIBOND NAB CT1 #1 30IN (SUTURE) ×3 IMPLANT
SUT MNCRL AB 4-0 PS2 18 (SUTURE) IMPLANT
SUT VIC AB 0 CT1 36 (SUTURE) ×3 IMPLANT
SUT VIC AB 1 CT1 36 (SUTURE) ×3 IMPLANT
SUT VIC AB 2-0 CT1 27 (SUTURE) ×6
SUT VIC AB 2-0 CT1 TAPERPNT 27 (SUTURE) ×2 IMPLANT
TRAY FOLEY CATH 14FRSI W/METER (CATHETERS) ×2 IMPLANT
WATER STERILE IRR 1000ML POUR (IV SOLUTION) ×4 IMPLANT
YANKAUER SUCT BULB TIP 10FT TU (MISCELLANEOUS) ×3 IMPLANT

## 2017-03-08 NOTE — Evaluation (Signed)
Physical Therapy Evaluation Patient Details Name: Stacy [name] MRN: [id number] DOB: December 02, 1965 Today's Date: 03/08/2017   History of Present Illness  Pt s/p R THR and with hx of colon CA  Clinical Impression  Pt s/p R THR and presents with decreased R LE strength/ROM and post op pain limiting functional mobility.  Pt should progress to dc home with family assist.    Follow Up Recommendations Home health PT;DC plan and follow up therapy as arranged by surgeon    Equipment Recommendations       Recommendations for Other Services OT consult     Precautions / Restrictions Precautions Precautions: Fall Restrictions Weight Bearing Restrictions: No Other Position/Activity Restrictions: WBAT      Mobility  Bed Mobility Overal bed mobility: Needs Assistance Bed Mobility: Supine to Sit     Supine to sit: Min assist     General bed mobility comments: cues for sequence and use of L LE to self assist  Transfers Overall transfer level: Needs assistance Equipment used: Rolling walker (2 wheeled) Transfers: Sit to/from Stand Sit to Stand: Min assist         General transfer comment: cues for LE management and use of UEs to self assist  Ambulation/Gait Ambulation/Gait assistance: Min assist;+2 physical assistance;+2 safety/equipment Ambulation Distance (Feet): 5 Feet Assistive device: Rolling walker (2 wheeled) Gait Pattern/deviations: Step-to pattern;Decreased step length - right;Decreased step length - left;Shuffle;Trunk flexed Gait velocity: decr Gait velocity interpretation: Below normal speed for age/gender General Gait Details: cues for sequence, posture and position from RW - ltd by pt "feeling weird"  BP 118/70  Stairs            Wheelchair Mobility    Modified Rankin (Stroke Patients Only)       Balance                                             Pertinent Vitals/Pain Pain Assessment: 0-10 Pain Score: 5  Pain Location: R  hip Pain Descriptors / Indicators: Aching;Sore Pain Intervention(s): Limited activity within patient's tolerance;Monitored during session;Premedicated before session;Ice applied    Home Living Family/patient expects to be discharged to:: Private residence Living Arrangements: Spouse/significant other Available Help at Discharge: Family Type of Home: House Home Access: Stairs to enter Entrance Stairs-Rails: None Entrance Stairs-Number of Steps: 2 Home Layout: One level Home Equipment: Environmental consultant - 2 wheels;Bedside commode;Cane - single point      Prior Function Level of Independence: Independent               Hand Dominance        Extremity/Trunk Assessment   Upper Extremity Assessment Upper Extremity Assessment: Overall WFL for tasks assessed    Lower Extremity Assessment Lower Extremity Assessment: RLE deficits/detail       Communication   Communication: No difficulties  Cognition Arousal/Alertness: Awake/alert Behavior During Therapy: WFL for tasks assessed/performed Overall Cognitive Status: Within Functional Limits for tasks assessed                                        General Comments      Exercises Total Joint Exercises Ankle Circles/Pumps: AROM;Both;15 reps;Supine   Assessment/Plan    PT Assessment Patient needs continued PT services  PT Problem List Decreased strength;Decreased range of motion;Decreased activity  tolerance;Decreased balance;Decreased mobility;Decreased knowledge of use of DME;Pain       PT Treatment Interventions DME instruction;Gait training;Stair training;Functional mobility training;Therapeutic activities;Therapeutic exercise;Patient/family education    PT Goals (Current goals can be found in the Care Plan section)  Acute Rehab PT Goals Patient Stated Goal: Regain IND  PT Goal Formulation: With patient Time For Goal Achievement: 03/15/17 Potential to Achieve Goals: Good    Frequency 7X/week   Barriers  to discharge        Co-evaluation               AM-PAC PT "6 Clicks" Daily Activity  Outcome Measure Difficulty turning over in bed (including adjusting bedclothes, sheets and blankets)?: Unable Difficulty moving from lying on back to sitting on the side of the bed? : Unable Difficulty sitting down on and standing up from a chair with arms (e.g., wheelchair, bedside commode, etc,.)?: Unable Help needed moving to and from a bed to chair (including a wheelchair)?: A Little Help needed walking in hospital room?: A Little Help needed climbing 3-5 steps with a railing? : A Lot 6 Click Score: 11    End of Session Equipment Utilized During Treatment: Gait belt Activity Tolerance: Patient limited by fatigue Patient left: in chair;with call bell/phone within reach;with family/visitor present Nurse Communication: Mobility status PT Visit Diagnosis: Difficulty in walking, not elsewhere classified (R26.2)    Time: 7416-3845 PT Time Calculation (min) (ACUTE ONLY): 25 min   Charges:   PT Evaluation $PT Eval Low Complexity: 1 Low PT Treatments $Therapeutic Activity: 8-22 mins   PT G Codes:        Pg [phone]   Stacy [name] 03/08/2017, 5:28 PM

## 2017-03-08 NOTE — Anesthesia Postprocedure Evaluation (Signed)
Anesthesia Post Note  Patient: Stacy [name]  Procedure(s) Performed: RIGHT TOTAL HIP ARTHROPLASTY ANTERIOR APPROACH (Right )     Patient location during evaluation: PACU Anesthesia Type: Spinal Level of consciousness: awake and alert Pain management: pain level controlled Vital Signs Assessment: post-procedure vital signs reviewed and stable Respiratory status: spontaneous breathing and respiratory function stable Cardiovascular status: blood pressure returned to baseline and stable Postop Assessment: no headache, no backache, spinal receding and no apparent nausea or vomiting Anesthetic complications: no    Last Vitals:  Vitals:   03/08/17 1150 03/08/17 1248  BP: (!) 143/68 (!) 131/95  Pulse: 75 88  Resp: 16 14  Temp: 36.5 C 36.6 C  SpO2: 100% 98%    Last Pain:  Vitals:   03/08/17 1248  TempSrc: Axillary  PainSc:                  Montez Hageman

## 2017-03-08 NOTE — Transfer of Care (Signed)
Immediate Anesthesia Transfer of Care Note  Patient: Stacy [name]  Procedure(s) Performed: RIGHT TOTAL HIP ARTHROPLASTY ANTERIOR APPROACH (Right )  Patient Location: PACU  Anesthesia Type:MAC and Spinal  Level of Consciousness: awake, alert , oriented and patient cooperative  Airway & Oxygen Therapy: Patient Spontanous Breathing and Patient connected to face mask oxygen  Post-op Assessment: Report given to RN and Post -op Vital signs reviewed and stable  Post vital signs: Reviewed and stable  Last Vitals:  Vitals:   03/08/17 0721  BP: (!) 188/89  Pulse: 88  Resp: 18  Temp: 36.8 C  SpO2: 98%    Last Pain:  Vitals:   03/08/17 0721  TempSrc: Oral      Patients Stated Pain Goal: 4 (90/93/11 2162)  Complications: No apparent anesthesia complications

## 2017-03-08 NOTE — Anesthesia Procedure Notes (Signed)
Date/Time: 03/08/2017 9:22 AM Performed by: Sharlette Dense, CRNA Oxygen Delivery Method: Simple face mask

## 2017-03-08 NOTE — Anesthesia Preprocedure Evaluation (Signed)
Anesthesia Evaluation  Patient identified by MRN, date of birth, ID band Patient awake    Reviewed: Allergy & Precautions, NPO status , Patient's Chart, lab work & pertinent test results  Airway Mallampati: II  TM Distance: >3 FB Neck ROM: Full    Dental no notable dental hx.    Pulmonary neg pulmonary ROS,    Pulmonary exam normal breath sounds clear to auscultation       Cardiovascular hypertension, Pt. on medications negative cardio ROS Normal cardiovascular exam Rhythm:Regular Rate:Normal     Neuro/Psych negative neurological ROS  negative psych ROS   GI/Hepatic negative GI ROS, Neg liver ROS, GERD  Poorly Controlled,  Endo/Other  negative endocrine ROS  Renal/GU negative Renal ROS  negative genitourinary   Musculoskeletal negative musculoskeletal ROS (+)   Abdominal   Peds negative pediatric ROS (+)  Hematology negative hematology ROS (+)   Anesthesia Other Findings   Reproductive/Obstetrics negative OB ROS                             Anesthesia Physical Anesthesia Plan  ASA: II  Anesthesia Plan: Spinal   Post-op Pain Management:    Induction: Intravenous  PONV Risk Score and Plan: 2 and Ondansetron, Dexamethasone and Treatment may vary due to age or medical condition  Airway Management Planned: Simple Face Mask  Additional Equipment:   Intra-op Plan:   Post-operative Plan:   Informed Consent: I have reviewed the patients History and Physical, chart, labs and discussed the procedure including the risks, benefits and alternatives for the proposed anesthesia with the patient or authorized representative who has indicated his/her understanding and acceptance.   Dental advisory given  Plan Discussed with: CRNA  Anesthesia Plan Comments:         Anesthesia Quick Evaluation

## 2017-03-08 NOTE — Brief Op Note (Signed)
03/08/2017  10:55 AM  PATIENT:  Stacy [name]  51 y.o. female  PRE-OPERATIVE DIAGNOSIS:  osteoarthritis right hip  POST-OPERATIVE DIAGNOSIS:  osteoarthritis right hip  PROCEDURE:  Procedure(s): RIGHT TOTAL HIP ARTHROPLASTY ANTERIOR APPROACH (Right)  SURGEON:  Surgeon(s) and Role:    Mcarthur Rossetti, MD - Primary  ANESTHESIA:   spinal  EBL:  200 mL   COUNTS:  YES   DICTATION: .Other Dictation: Dictation Number [id number]  PLAN OF CARE: Admit to inpatient   PATIENT DISPOSITION:  PACU - hemodynamically stable.   Delay start of Pharmacological VTE agent (>24hrs) due to surgical blood loss or risk of bleeding: no

## 2017-03-08 NOTE — H&P (Signed)
TOTAL HIP ADMISSION H&P  Patient is admitted for right total hip arthroplasty.  Subjective:  Chief Complaint: right hip pain  HPI: Stacy [name], 51 y.o. female, has a history of pain and functional disability in the right hip(s) due to arthritis and patient has failed non-surgical conservative treatments for greater than 12 weeks to include NSAID's and/or analgesics, corticosteriod injections, flexibility and strengthening excercises, supervised PT with diminished ADL's post treatment, use of assistive devices, weight reduction as appropriate and activity modification.  Onset of symptoms was gradual starting 3 years ago with gradually worsening course since that time.The patient noted no past surgery on the right hip(s).  Patient currently rates pain in the right hip at 10 out of 10 with activity. Patient has night pain, worsening of pain with activity and weight bearing, trendelenberg gait, pain that interfers with activities of daily living and pain with passive range of motion. Patient has evidence of subchondral cysts, subchondral sclerosis, periarticular osteophytes and joint space narrowing by imaging studies. This condition presents safety issues increasing the risk of falls.  There is no current active infection.  Patient Active Problem List   Diagnosis Date Noted  . Cancer of sigmoid colon ([place]) 11/09/2016  . Pain of right hip joint 09/19/2016  . Unilateral primary osteoarthritis, right hip 09/19/2016   Past Medical History:  Diagnosis Date  . Arthritis    right hip  . Cancer [facility])    sigmoid colon cancer  . Family history of adverse reaction to anesthesia    father:confusion,disorientation  . GERD (gastroesophageal reflux disease)   . Hypertension     Past Surgical History:  Procedure Laterality Date  . adnoidectomy     as child  . LAPAROSCOPIC SIGMOID COLECTOMY N/A 11/09/2016   Procedure: LAPAROSCOPIC SIGMOID COLECTOMY;  Surgeon: Stark Klein, MD;  Location: [place];   Service: General;  Laterality: N/A;  . MULTIPLE TOOTH EXTRACTIONS     as a child  . MYRINGOTOMY     as a child  . PARTIAL COLECTOMY  11/09/2016   laparoscopic partial colectomy for sigmoid colon cancer  . SIGMOIDOSCOPY  03/01/2017   post-op eval   . TOOTH EXTRACTION      Current Facility-Administered Medications  Medication Dose Route Frequency Provider Last Rate Last Dose  . ceFAZolin (ANCEF) 2-4 GM/100ML-% IVPB           . ceFAZolin (ANCEF) IVPB 2g/100 mL premix  2 g Intravenous On Call to OR Mcarthur Rossetti, MD      . lactated ringers infusion   Intravenous Continuous Montez Hageman, MD 100 mL/hr at 03/08/17 0758 1,000 mL at 03/08/17 0758  . tranexamic acid (CYKLOKAPRON) 1,000 mg in sodium chloride 0.9 % 100 mL IVPB  1,000 mg Intravenous To OR Mcarthur Rossetti, MD       Allergies  Allergen Reactions  . Vicodin [Hydrocodone-Acetaminophen] Rash    Social History   Tobacco Use  . Smoking status: Never Smoker  . Smokeless tobacco: Never Used  Substance Use Topics  . Alcohol use: Yes    Comment: infrequently     Family History  Problem Relation Age of Onset  . Breast cancer Mother      Review of Systems  Musculoskeletal: Positive for joint pain.    Objective:  Physical Exam  Constitutional: She is oriented to person, place, and time. She appears well-developed and well-nourished.  HENT:  Head: Normocephalic and atraumatic.  Eyes: Pupils are equal, round, and reactive to light.  Neck: Normal  range of motion. Neck supple.  Cardiovascular: Normal rate.  Respiratory: Effort normal.  GI: Soft.  Musculoskeletal:       Right hip: She exhibits decreased range of motion, decreased strength, tenderness and bony tenderness.  Neurological: She is alert and oriented to person, place, and time.  Skin: Skin is warm and dry.  Psychiatric: She has a normal mood and affect.    Vital signs in last 24 hours: Temp:  [98.3 F (36.8 C)] 98.3 F (36.8 C) (12/27  0721) Pulse Rate:  [88] 88 (12/27 0721) Resp:  [18] 18 (12/27 0721) BP: (188)/(89) 188/89 (12/27 0721) SpO2:  [98 %] 98 % (12/27 0721) Weight:  [173 lb (78.5 kg)] 173 lb (78.5 kg) (12/27 0744)  Labs:   Estimated body mass index is 27.92 kg/m as calculated from the following:   Height as of this encounter: 5\' 6"  (1.676 m).   Weight as of this encounter: 173 lb (78.5 kg).   Imaging Review Plain radiographs demonstrate severe degenerative joint disease of the right hip(s). The bone quality appears to be good for age and reported activity level.  Assessment/Plan:  End stage arthritis, right hip(s)  The patient history, physical examination, clinical judgement of the provider and imaging studies are consistent with end stage degenerative joint disease of the right hip(s) and total hip arthroplasty is deemed medically necessary. The treatment options including medical management, injection therapy, arthroscopy and arthroplasty were discussed at length. The risks and benefits of total hip arthroplasty were presented and reviewed. The risks due to aseptic loosening, infection, stiffness, dislocation/subluxation,  thromboembolic complications and other imponderables were discussed.  The patient acknowledged the explanation, agreed to proceed with the plan and consent was signed. Patient is being admitted for inpatient treatment for surgery, pain control, PT, OT, prophylactic antibiotics, VTE prophylaxis, progressive ambulation and ADL's and discharge planning.The patient is planning to be discharged home with home health services

## 2017-03-09 LAB — CBC
HCT: 38.2 % (ref 36.0–46.0)
Hemoglobin: 12.8 g/dL (ref 12.0–15.0)
MCH: 28.1 pg (ref 26.0–34.0)
MCHC: 33.5 g/dL (ref 30.0–36.0)
MCV: 83.8 fL (ref 78.0–100.0)
PLATELETS: 206 10*3/uL (ref 150–400)
RBC: 4.56 MIL/uL (ref 3.87–5.11)
RDW: 12.8 % (ref 11.5–15.5)
WBC: 18.7 10*3/uL — AB (ref 4.0–10.5)

## 2017-03-09 LAB — BASIC METABOLIC PANEL
ANION GAP: 6 (ref 5–15)
BUN: 8 mg/dL (ref 6–20)
CALCIUM: 8.2 mg/dL — AB (ref 8.9–10.3)
CO2: 27 mmol/L (ref 22–32)
Chloride: 105 mmol/L (ref 101–111)
Creatinine, Ser: 0.8 mg/dL (ref 0.44–1.00)
Glucose, Bld: 116 mg/dL — ABNORMAL HIGH (ref 65–99)
Potassium: 3.2 mmol/L — ABNORMAL LOW (ref 3.5–5.1)
Sodium: 138 mmol/L (ref 135–145)

## 2017-03-09 NOTE — Progress Notes (Signed)
Subjective: 1 Day Post-Op Procedure(s) (LRB): RIGHT TOTAL HIP ARTHROPLASTY ANTERIOR APPROACH (Right) Patient reports pain as moderate.    Objective: Vital signs in last 24 hours: Temp:  [97.7 F (36.5 C)-98.6 F (37 C)] 98.6 F (37 C) (12/28 0511) Pulse Rate:  [63-88] 74 (12/28 0511) Resp:  [10-16] 15 (12/28 0511) BP: (105-157)/(57-95) 157/67 (12/28 0511) SpO2:  [96 %-100 %] 98 % (12/28 0511) Weight:  [173 lb (78.5 kg)] 173 lb (78.5 kg) (12/27 0744)  Intake/Output from previous day: 12/27 0701 - 12/28 0700 In: 4238.8 [P.O.:1160; I.V.:2763.8; IV Piggyback:315] Out: 2925 [Urine:2725; Blood:200] Intake/Output this shift: No intake/output data recorded.  Recent Labs    03/09/17 0524  HGB 12.8   Recent Labs    03/09/17 0524  WBC 18.7*  RBC 4.56  HCT 38.2  PLT 206   Recent Labs    03/09/17 0524  NA 138  K 3.2*  CL 105  CO2 27  BUN 8  CREATININE 0.80  GLUCOSE 116*  CALCIUM 8.2*   No results for input(s): LABPT, INR in the last 72 hours.  Sensation intact distally Intact pulses distally Dorsiflexion/Plantar flexion intact Incision: scant drainage  Assessment/Plan: 1 Day Post-Op Procedure(s) (LRB): RIGHT TOTAL HIP ARTHROPLASTY ANTERIOR APPROACH (Right) Up with therapy Plan for discharge tomorrow Discharge home with home health  Mcarthur Rossetti 03/09/2017, 7:43 AM

## 2017-03-09 NOTE — Progress Notes (Signed)
Physical Therapy Treatment Patient Details Name: Stacy [name] MRN: [id number] DOB: 16-Sep-1965 Today's Date: 03/09/2017    History of Present Illness Pt s/p R THR and with hx of colon CA    PT Comments    Pt performed LE exercises and ambulated in hallway.  Pt reports d/c home likely tomorrow.  Follow Up Recommendations  Home health PT;DC plan and follow up therapy as arranged by surgeon     Equipment Recommendations  None recommended by PT    Recommendations for Other Services       Precautions / Restrictions Precautions Precautions: Fall Restrictions Other Position/Activity Restrictions: WBAT    Mobility  Bed Mobility         Supine to sit: Min assist     General bed mobility comments: pt up in recliner  Transfers Overall transfer level: Needs assistance Equipment used: Rolling walker (2 wheeled) Transfers: Sit to/from Stand Sit to Stand: Min guard         General transfer comment: for safety; cues for UE/LE placement  Ambulation/Gait Ambulation/Gait assistance: Min guard Ambulation Distance (Feet): 140 Feet Assistive device: Rolling walker (2 wheeled) Gait Pattern/deviations: Step-to pattern;Decreased stance time - right Gait velocity: decr   General Gait Details: verbal cues for sequence, step length, RW positioning   Stairs            Wheelchair Mobility    Modified Rankin (Stroke Patients Only)       Balance                                            Cognition Arousal/Alertness: Awake/alert Behavior During Therapy: WFL for tasks assessed/performed Overall Cognitive Status: Within Functional Limits for tasks assessed                                        Exercises Total Joint Exercises Ankle Circles/Pumps: AROM;Both;10 reps Quad Sets: AROM;10 reps;Both Short Arc Quad: AROM;Seated;Right Heel Slides: AAROM;Right;Supine;10 reps Hip ABduction/ADduction: Supine;Right;AAROM;10 reps     General Comments        Pertinent Vitals/Pain Pain Assessment: 0-10 Pain Score: 3  Pain Location: R hip Pain Descriptors / Indicators: Sore Pain Intervention(s): Limited activity within patient's tolerance;Monitored during session;Repositioned;Ice applied    Home Living Family/patient expects to be discharged to:: Private residence Living Arrangements: Spouse/significant other Available Help at Discharge: Family         Home Equipment: Gilford Rile - 2 wheels;Bedside commode;Cane - single point Additional Comments: reacher    Prior Function Level of Independence: Independent          PT Goals (current goals can now be found in the care plan section) Acute Rehab PT Goals Patient Stated Goal: Regain IND  Progress towards PT goals: Progressing toward goals    Frequency    7X/week      PT Plan Current plan remains appropriate    Co-evaluation              AM-PAC PT "6 Clicks" Daily Activity  Outcome Measure  Difficulty turning over in bed (including adjusting bedclothes, sheets and blankets)?: A Little Difficulty moving from lying on back to sitting on the side of the bed? : A Little Difficulty sitting down on and standing up from a chair with arms (e.g., wheelchair, bedside commode, etc,.)?:  A Little Help needed moving to and from a bed to chair (including a wheelchair)?: A Little Help needed walking in hospital room?: A Little Help needed climbing 3-5 steps with a railing? : A Little 6 Click Score: 18    End of Session   Activity Tolerance: Patient tolerated treatment well Patient left: in chair;with call bell/phone within reach   PT Visit Diagnosis: Difficulty in walking, not elsewhere classified (R26.2)     Time: 1216-2446 PT Time Calculation (min) (ACUTE ONLY): 20 min  Charges:  $Therapeutic Exercise: 8-22 mins                    G Codes:       Stacy [name], PT, DPT 03/09/2017 Pager: [phone]  York Ram E 03/09/2017, 12:56 PM

## 2017-03-09 NOTE — Evaluation (Signed)
Occupational Therapy Evaluation Patient Details Name: Stacy [name] MRN: [id number] DOB: Jul 08, 1965 Today's Date: 03/09/2017    History of Present Illness Pt s/p R THR and with hx of colon CA   Clinical Impression   This 51 year old female was admitted for the above sx.  She was independent with adls prior to admission and now needs min guard for ambulating and up to max A for LB dressing. She has a Secondary school teacher at home as well as assistance. Will see her one more time to review shower transfers and use of reacher for ADLs    Follow Up Recommendations  Supervision/Assistance - 24 hour    Equipment Recommendations  None recommended by OT    Recommendations for Other Services       Precautions / Restrictions Precautions Precautions: Fall Restrictions Weight Bearing Restrictions: No Other Position/Activity Restrictions: WBAT      Mobility Bed Mobility         Supine to sit: Min assist     General bed mobility comments: for RLE  Transfers   Equipment used: Rolling walker (2 wheeled)   Sit to Stand: Min guard         General transfer comment: for safety; cues for UE/LEplacement    Balance                                           ADL either performed or assessed with clinical judgement   ADL Overall ADL's : Needs assistance/impaired Eating/Feeding: Independent   Grooming: Set up;Sitting   Upper Body Bathing: Set up;Sitting   Lower Body Bathing: Minimal assistance;Sit to/from stand   Upper Body Dressing : Set up;Sitting   Lower Body Dressing: Maximal assistance;Sit to/from stand   Toilet Transfer: Min guard;RW;Ambulation;BSC   Toileting- Water quality scientist and Hygiene: Min guard;Sit to/from stand         General ADL Comments: ambulated to bathroom and performed adl and toilet transfer on 3:1.  Pt did not have clothing here, but she has a Secondary school teacher at home     Vision         Perception     Praxis      Pertinent  Vitals/Pain Pain Score: 3  Pain Location: R hip Pain Descriptors / Indicators: Spasm Pain Intervention(s): Limited activity within patient's tolerance;Monitored during session;Premedicated before session;Repositioned;Ice applied     Hand Dominance     Extremity/Trunk Assessment Upper Extremity Assessment Upper Extremity Assessment: Overall WFL for tasks assessed           Communication Communication Communication: No difficulties   Cognition Arousal/Alertness: Awake/alert Behavior During Therapy: WFL for tasks assessed/performed Overall Cognitive Status: Within Functional Limits for tasks assessed                                     General Comments       Exercises     Shoulder Instructions      Home Living Family/patient expects to be discharged to:: Private residence Living Arrangements: Spouse/significant other Available Help at Discharge: Family               Bathroom Shower/Tub: Walk-in Psychologist, prison and probation services: Standard     Home Equipment: Environmental consultant - 2 wheels;Bedside commode;Cane - single point   Additional Comments: reacher  Prior Functioning/Environment Level of Independence: Independent                 OT Problem List: Decreased activity tolerance;Pain;Decreased knowledge of use of DME or AE      OT Treatment/Interventions:      OT Goals(Current goals can be found in the care plan section) Acute Rehab OT Goals Patient Stated Goal: Regain IND  OT Goal Formulation: With patient ADL Goals Pt Will Perform Tub/Shower Transfer: Shower transfer;with min guard assist;ambulating;3 in 1 Additional ADL Goal #1: pt will use reacher to don pants and underwear at supervision level  OT Frequency:     Barriers to D/C:            Co-evaluation              AM-PAC PT "6 Clicks" Daily Activity     Outcome Measure Help from another person eating meals?: None Help from another person taking care of personal grooming?: A  Little Help from another person toileting, which includes using toliet, bedpan, or urinal?: A Little Help from another person bathing (including washing, rinsing, drying)?: A Little Help from another person to put on and taking off regular upper body clothing?: A Little Help from another person to put on and taking off regular lower body clothing?: A Lot 6 Click Score: 18   End of Session    Activity Tolerance: Patient tolerated treatment well Patient left: in chair;with call bell/phone within reach  OT Visit Diagnosis: Pain Pain - Right/Left: Right Pain - part of body: Hip                Time: 5852-7782 OT Time Calculation (min): 32 min Charges:  OT General Charges $OT Visit: 1 Visit OT Evaluation $OT Eval Low Complexity: 1 Low OT Treatments $Self Care/Home Management : 8-22 mins G-Codes:     .[place], OTR/L [phone] 03/09/2017  Stacy [name] 03/09/2017, 9:34 AM

## 2017-03-09 NOTE — Op Note (Signed)
Stacy [name], [name] NO.:  [id number]  MEDICAL RECORD NO.:  [id number]  LOCATION:  [facility]                         FACILITY:  [facility]  PHYSICIAN:  Lind Guest. Ninfa Linden, M.D.DATE OF BIRTH:  1965-07-24  DATE OF PROCEDURE:  03/08/2017 DATE OF DISCHARGE:                              OPERATIVE REPORT   PREOPERATIVE DIAGNOSIS:  Primary osteoarthritis and degenerative joint disease, right hip.  POSTOPERATIVE DIAGNOSIS:  Primary osteoarthritis and degenerative joint disease, right hip.  PROCEDURE:  Right total hip arthroplasty through direct anterior approach.  IMPLANTS:  DePuy Sector Gription acetabular component size 52, size 36 +0 neutral polyethylene liner, size 12 Corail femoral component with varus offset, size 36 +1.5 ceramic hip ball.  SURGEON:  Lind Guest. Ninfa Linden, M.D.  ASSISTANT:  OR staff.  ANESTHESIA:  Spinal.  ANTIBIOTICS:  2 g of IV Ancef.  BLOOD LOSS:  200 mL.  COMPLICATIONS:  None.  INDICATIONS:  Stacy [name] is a 51 year old female, well known to me. She has had 5 or more years of worsening right hip pain.  Radiographic evidence and x-rays show significant loss of her joint space with complete bone-on-bone wear at the superior lateral aspect as well as sclerotic changes and periarticular osteophytes.  She has tried and failed all forms of conservative treatment.  Her pain is daily and has detrimentally affected her activities of daily living, her quality of life, and her mobility.  At this point, she does wish to proceed with a total hip arthroplasty.  She understands the risk of acute blood loss anemia, nerve and vessel injury, fracture, infection, dislocation, and DVT.  She understands our goals are to decrease pain, improve mobility, and overall improve quality of life.  PROCEDURE DESCRIPTION:  After informed consent was obtained, appropriate right hip was marked.  She was brought to the operating room.  Spinal anesthesia was  obtained while she was on her stretcher.  A Foley catheter was placed and both feet had traction boots applied to them. Next, she was placed supine on the Hana fracture table with the perineal post in place and both legs in inline skeletal traction devices, but no traction applied.  Her right operative hip was prepped and draped with DuraPrep and sterile drapes.  Time-out was called and she was identified as correct patient and correct right hip.  We then made an incision just inferior and posterior to the anterior superior iliac spine and carried this obliquely down the leg.  We dissected down the tensor fascia lata muscle.  The tensor fascia was then divided longitudinally to proceed with a direct anterior approach to the hip.  We identified and cauterized the circumflex vessels and identified the hip capsule.  I opened up the hip capsule in an L-type format, finding a large joint effusion and significant severe arthritis throughout her right hip.  We removed periarticular osteophytes from the acetabulum as well as the femoral head.  We then made our femoral neck cut with an oscillating saw after placing Cobra retractor on the medial and lateral femoral neck. We made this cut without difficulty just proximal to the lesser trochanter and completed this with an osteotome.  I placed a corkscrew guide  in the femoral head and removed the femoral head in its entirety and found a wide area devoid of cartilage.  I then placed a bent Hohmann over the medial acetabular rim and removed remnants of the acetabular labrum and other debris as well as periarticular osteophytes.  I then began reaming under direct visualization from a size 42 reamer in stepwise increments up to a size 52 with all reamers under direct visualization, the last reamer under direct fluoroscopy, so we could obtain our depth of reaming, our inclination, and anteversion.  Once we were pleased with this, we placed the real DePuy  Sector Gription acetabular component size 52 and a 36 +0 neutral polyethylene liner for that size acetabular component.  Attention was then turned to the femur. With the leg externally rotated to 120 degrees, extended and adducted, we were able to place a Mueller retractor medially and a Hohmann retractor behind the greater trochanter.  We released the lateral joint capsule and used a box cutting osteotome to enter the femoral canal and a rongeur to lateralize.  We then began broaching from a size 8 broach using the Corail broaching system, going up to a size 12.  With a size 12 in place, based on her anatomy and radiographic evidence, we placed a varus offset femoral neck and a 36 +1.5 hip ball.  We the rolled leg back over and up with traction and internal rotation, reduced into the pelvis, and we were pleased with leg length, offset, range of motion, and stability.  We dislocated the hip and removed the trial components. We were able to place the real Corail femoral component with varus offset size 12 and the real 36 +1.5 ceramic hip ball and again reduced this in the acetabulum and we were pleased with stability, range of motion, offset, and leg length as measured radiographically as well.  We then irrigated the soft tissue with normal saline solution using pulsatile lavage.  We were able to close the joint capsule with interrupted #1 Ethibond suture followed by running #1 Vicryl in the tensor fascia, 0 Vicryl in the deep tissue, 2-0 Vicryl in the subcutaneous tissue, 4-0 Monocryl subcuticular stitch and Steri-Strips on the skin.  An Aquacel dressing was applied.  She was taken off the Hana table and taken to the recovery room in stable condition.  All final counts were correct.  There were no complications noted.     Lind Guest. Ninfa Linden, M.D.     CYB/MEDQ  D:  03/08/2017  T:  03/09/2017  Job:  [id number]

## 2017-03-09 NOTE — Progress Notes (Signed)
Physical Therapy Treatment Patient Details Name: Stacy [name] MRN: [id number] DOB: 18-Sep-1965 Today's Date: 03/09/2017    History of Present Illness Pt s/p R THR and with hx of colon CA    PT Comments    Pt ambulated in hallway and assisted to bathroom.  Pt progressing well and anticipates d/c home tomorrow.  Follow Up Recommendations  Home health PT;DC plan and follow up therapy as arranged by surgeon     Equipment Recommendations  None recommended by PT    Recommendations for Other Services       Precautions / Restrictions Precautions Precautions: Fall Restrictions Other Position/Activity Restrictions: WBAT    Mobility  Bed Mobility               General bed mobility comments: pt up in recliner  Transfers Overall transfer level: Needs assistance Equipment used: Rolling walker (2 wheeled) Transfers: Sit to/from Stand Sit to Stand: Min guard         General transfer comment: for safety; cues for UE/LE placement  Ambulation/Gait Ambulation/Gait assistance: Min guard;Supervision Ambulation Distance (Feet): 120 Feet Assistive device: Rolling walker (2 wheeled) Gait Pattern/deviations: Decreased stance time - right;Step-through pattern;Antalgic Gait velocity: decr   General Gait Details: verbal cues step length, RW positioning, able to improve to step through pattern   Stairs            Wheelchair Mobility    Modified Rankin (Stroke Patients Only)       Balance                                            Cognition Arousal/Alertness: Awake/alert Behavior During Therapy: WFL for tasks assessed/performed Overall Cognitive Status: Within Functional Limits for tasks assessed                                        Exercises     General Comments        Pertinent Vitals/Pain Pain Assessment: 0-10 Pain Score: 3  Pain Location: R hip Pain Descriptors / Indicators: Sore Pain Intervention(s):  Repositioned;Limited activity within patient's tolerance;Monitored during session    Home Living                      Prior Function            PT Goals (current goals can now be found in the care plan section) Progress towards PT goals: Progressing toward goals    Frequency    7X/week      PT Plan Current plan remains appropriate    Co-evaluation              AM-PAC PT "6 Clicks" Daily Activity  Outcome Measure  Difficulty turning over in bed (including adjusting bedclothes, sheets and blankets)?: A Little Difficulty moving from lying on back to sitting on the side of the bed? : A Little Difficulty sitting down on and standing up from a chair with arms (e.g., wheelchair, bedside commode, etc,.)?: A Little Help needed moving to and from a bed to chair (including a wheelchair)?: A Little Help needed walking in hospital room?: A Little Help needed climbing 3-5 steps with a railing? : A Little 6 Click Score: 18    End of Session Equipment Utilized During Treatment: Gait  belt Activity Tolerance: Patient tolerated treatment well Patient left: in chair;with call bell/phone within reach;with family/visitor present Nurse Communication: Mobility status PT Visit Diagnosis: Difficulty in walking, not elsewhere classified (R26.2)     Time: 5883-2549 PT Time Calculation (min) (ACUTE ONLY): 18 min  Charges:  $Gait Training: 8-22 mins s                    G Codes:       Carmelia Bake, PT, DPT 03/09/2017 Pager: [phone]  York Ram E 03/09/2017, 3:36 PM

## 2017-03-10 MED ORDER — METHOCARBAMOL 500 MG PO TABS: 500.0000 mg | ORAL_TABLET | Freq: Four times a day (QID) | ORAL | 0 refills | Status: DC | PRN

## 2017-03-10 MED ORDER — OXYCODONE-ACETAMINOPHEN 5-325 MG PO TABS: 1.0000 | ORAL_TABLET | ORAL | 0 refills | Status: DC | PRN

## 2017-03-10 MED ORDER — ASPIRIN 325 MG PO TBEC
325.0000 mg | DELAYED_RELEASE_TABLET | Freq: Every day | ORAL | 0 refills | Status: DC
Start: 2017-03-11 — End: 2017-12-26

## 2017-03-10 NOTE — Discharge Summary (Signed)
Patient ID: Stacy [name] MRN: [id number] DOB/AGE: 09/30/1965 51 y.o.  Admit date: 03/08/2017 Discharge date: 03/10/2017  Admission Diagnoses:  Principal Problem:   Unilateral primary osteoarthritis, right hip Active Problems:   Status post total replacement of right hip   Discharge Diagnoses:  Same  Past Medical History:  Diagnosis Date  . Arthritis    right hip  . Cancer [facility])    sigmoid colon cancer  . Family history of adverse reaction to anesthesia    father:confusion,disorientation  . GERD (gastroesophageal reflux disease)   . Hypertension     Surgeries: Procedure(s): RIGHT TOTAL HIP ARTHROPLASTY ANTERIOR APPROACH on 03/08/2017   Consultants:   Discharged Condition: Improved  Hospital Course: Stacy [name] is an 51 y.o. female who was admitted 03/08/2017 for operative treatment ofUnilateral primary osteoarthritis, right hip. Patient has severe unremitting pain that affects sleep, daily activities, and work/hobbies. After pre-op clearance the patient was taken to the operating room on 03/08/2017 and underwent  Procedure(s): RIGHT TOTAL HIP ARTHROPLASTY ANTERIOR APPROACH.    Patient was given perioperative antibiotics:  Anti-infectives (From admission, onward)   Start     Dose/Rate Route Frequency Ordered Stop   03/08/17 1600  ceFAZolin (ANCEF) IVPB 1 g/50 mL premix     1 g 100 mL/hr over 30 Minutes Intravenous Every 6 hours 03/08/17 1155 03/08/17 2150   03/08/17 0726  ceFAZolin (ANCEF) 2-4 GM/100ML-% IVPB    Comments:  [place] Session   : cabinet override      03/08/17 0726 03/08/17 0925   03/08/17 0720  ceFAZolin (ANCEF) IVPB 2g/100 mL premix     2 g 200 mL/hr over 30 Minutes Intravenous On call to O.R. 03/08/17 0720 03/08/17 0940       Patient was given sequential compression devices, early ambulation, and chemoprophylaxis to prevent DVT.  Patient benefited maximally from hospital stay and there were no complications.    Recent vital signs:  Patient  Vitals for the past 24 hrs:  BP Temp Temp src Pulse Resp SpO2  03/10/17 0607 131/68 98.2 F (36.8 C) Oral 85 16 98 %  03/09/17 2212 139/79 98 F (36.7 C) Oral 80 16 99 %  03/09/17 1331 (!) 141/59 98 F (36.7 C) Oral 73 16 98 %  03/09/17 1003 (!) 124/91 97.6 F (36.4 C) Oral 66 16 99 %     Recent laboratory studies:  Recent Labs    03/09/17 0524  WBC 18.7*  HGB 12.8  HCT 38.2  PLT 206  NA 138  K 3.2*  CL 105  CO2 27  BUN 8  CREATININE 0.80  GLUCOSE 116*  CALCIUM 8.2*     Discharge Medications:   Allergies as of 03/10/2017      Reactions   Vicodin [hydrocodone-acetaminophen] Rash      Medication List    TAKE these medications   aspirin 325 MG EC tablet Take 1 tablet (325 mg total) by mouth daily with breakfast. Start taking on:  03/11/2017   CALCIUM-MAGNESIUM-ZINC PO Take 1 tablet by mouth 3 (three) times daily.   estrogen (conjugated)-medroxyprogesterone 0.625-2.5 MG tablet Commonly known as:  PREMPRO Take 1 tablet by mouth daily.   GLUCOSAMINE CHOND DOUBLE STR PO Take 1 tablet by mouth 2 (two) times daily.   hydrochlorothiazide 25 MG tablet Commonly known as:  HYDRODIURIL Take 25 mg by mouth daily.   methocarbamol 500 MG tablet Commonly known as:  ROBAXIN Take 1 tablet (500 mg total) by mouth every 6 (six) hours as needed for  muscle spasms.   oxyCODONE-acetaminophen 5-325 MG tablet Commonly known as:  ROXICET Take 1-2 tablets by mouth every 4 (four) hours as needed.   potassium chloride 10 MEQ tablet Commonly known as:  K-DUR Take 10 mEq by mouth daily.   traMADol 50 MG tablet Commonly known as:  ULTRAM Take 1-2 tablets (50-100 mg total) by mouth every 6 (six) hours as needed for moderate pain or severe pain.   VITAMIN D3 PO Take 1 capsule by mouth daily.            Durable Medical Equipment  (From admission, onward)        Start     Ordered   03/08/17 1156  DME 3 n 1  Once     03/08/17 1155   03/08/17 1156  DME Walker rolling   Once    Question:  Patient needs a walker to treat with the following condition  Answer:  Status post total replacement of right hip   03/08/17 1155      Diagnostic Studies: Dg Pelvis Portable  Result Date: 03/08/2017 CLINICAL DATA:  Status post right total hip joint prosthesis placement. EXAM: PORTABLE PELVIS 1-2 VIEWS COMPARISON:  Intraoperative fluoro spot images of today's date FINDINGS: The patient has undergone right total hip joint prosthesis placement. Radiographic positioning of the prosthetic components is good. The interface with the native bone appears normal. There is no acute native bone fracture. There is gas within the soft tissues of the right thigh. IMPRESSION: No immediate postprocedure complication following right hip joint prosthesis placement. Electronically Signed   By: David  Martinique M.D.   On: 03/08/2017 11:15   Dg C-arm 1-60 Min-no Report  Result Date: 03/08/2017 Fluoroscopy was utilized by the requesting physician.  No radiographic interpretation.   Dg Hip Operative Unilat W Or W/o Pelvis Right  Result Date: 03/08/2017 CLINICAL DATA:  Right hip replacement EXAM: OPERATIVE right HIP (WITH PELVIS IF PERFORMED) 1 VIEWS TECHNIQUE: Fluoroscopic spot image(s) were submitted for interpretation post-operatively. COMPARISON:  08/31/2016 FINDINGS: Intraoperative spot fluoro images of the right hip demonstrate total hip arthroplasty. No evidence for immediate hardware complications. IMPRESSION: Status post right total hip replacement without complicating features. Electronically Signed   By: Misty Stanley M.D.   On: 03/08/2017 10:57    Disposition: 01-Home or Self Care  Discharge Instructions    Discharge patient   Complete by:  As directed    Discharge disposition:  01-Home or Self Care   Discharge patient date:  03/10/2017      Follow-up Information    Mcarthur Rossetti, MD Follow up in 2 week(s).   Specialty:  Orthopedic Surgery Contact information: [place] [address] [phone]            Signed: Mcarthur Rossetti 03/10/2017, 8:42 AM

## 2017-03-10 NOTE — Progress Notes (Signed)
Physical Therapy Treatment Patient Details Name: Stacy [name] MRN: [id number] DOB: 1965/08/02 Today's Date: 03/10/2017    History of Present Illness Pt s/p R THR and with hx of colon CA    PT Comments    Pt progressing well; all questions answered regarding mobility /progression/recovery; will benefit from HHPT  Follow Up Recommendations  Home health PT;DC plan and follow up therapy as arranged by surgeon     Equipment Recommendations  None recommended by PT    Recommendations for Other Services       Precautions / Restrictions Precautions Precautions: Fall Precaution Comments: reviewed avoiding excessive/end range hip extension Restrictions Weight Bearing Restrictions: No Other Position/Activity Restrictions: WBAT    Mobility  Bed Mobility Overal bed mobility: Needs Assistance             General bed mobility comments: pt up in recliner  Transfers Overall transfer level: Needs assistance Equipment used: Rolling walker (2 wheeled) Transfers: Sit to/from Stand Sit to Stand: Min guard;Supervision         General transfer comment: for safety; cues for UE/LE placement  Ambulation/Gait Ambulation/Gait assistance: Supervision Ambulation Distance (Feet): 75 Feet Assistive device: Rolling walker (2 wheeled) Gait Pattern/deviations: Decreased stance time - right;Step-through pattern;Antalgic Gait velocity: decr Gait velocity interpretation: Below normal speed for age/gender General Gait Details: verbal cues step length, RW positioning, able to improve to step through pattern end of distnace but remains guarded   Stairs Stairs: Yes   Stair Management: No rails;Step to pattern;Backwards;With walker Number of Stairs: 3 General stair comments: cues fro technique and sequence  Wheelchair Mobility    Modified Rankin (Stroke Patients Only)       Balance                                            Cognition Arousal/Alertness:  Awake/alert Behavior During Therapy: WFL for tasks assessed/performed Overall Cognitive Status: Within Functional Limits for tasks assessed                                        Exercises Total Joint Exercises Ankle Circles/Pumps: AROM;Both;10 reps Quad Sets: AROM;10 reps;Both Short Arc Quad: AROM;5 reps;Right Heel Slides: AAROM;Right;Supine;5 reps Hip ABduction/ADduction: AROM;Right;5 reps    General Comments        Pertinent Vitals/Pain Pain Assessment: 0-10 Pain Score: 4  Pain Location: R hip Pain Descriptors / Indicators: Sore Pain Intervention(s): Limited activity within patient's tolerance;Monitored during session;Premedicated before session;Ice applied    Home Living                      Prior Function            PT Goals (current goals can now be found in the care plan section) Acute Rehab PT Goals Patient Stated Goal: Regain IND  PT Goal Formulation: With patient Time For Goal Achievement: 03/15/17 Potential to Achieve Goals: Good Progress towards PT goals: Progressing toward goals    Frequency    7X/week      PT Plan Current plan remains appropriate    Co-evaluation              AM-PAC PT "6 Clicks" Daily Activity  Outcome Measure  Difficulty turning over in bed (including adjusting bedclothes, sheets and blankets)?: A  Little Difficulty moving from lying on back to sitting on the side of the bed? : A Little Difficulty sitting down on and standing up from a chair with arms (e.g., wheelchair, bedside commode, etc,.)?: A Little Help needed moving to and from a bed to chair (including a wheelchair)?: A Little Help needed walking in hospital room?: A Little Help needed climbing 3-5 steps with a railing? : A Little 6 Click Score: 18    End of Session Equipment Utilized During Treatment: Gait belt Activity Tolerance: Patient tolerated treatment well Patient left: in chair;with call bell/phone within reach;with  family/visitor present Nurse Communication: Mobility status PT Visit Diagnosis: Difficulty in walking, not elsewhere classified (R26.2)     Time: 1020-1053 PT Time Calculation (min) (ACUTE ONLY): 33 min  Charges:  $Gait Training: 8-22 mins $Therapeutic Exercise: 8-22 mins                    G CodesKenyon Ana, PT Pager: [phone] 03/10/2017    Kenyon Ana 03/10/2017, 12:24 PM

## 2017-03-10 NOTE — Progress Notes (Signed)
Subjective: 2 Days Post-Op Procedure(s) (LRB): RIGHT TOTAL HIP ARTHROPLASTY ANTERIOR APPROACH (Right) Patient reports pain as moderate.  Doing well overall.  Objective: Vital signs in last 24 hours: Temp:  [97.6 F (36.4 C)-98.2 F (36.8 C)] 98.2 F (36.8 C) (12/29 0607) Pulse Rate:  [66-85] 85 (12/29 0607) Resp:  [16] 16 (12/29 0607) BP: (124-141)/(59-91) 131/68 (12/29 0607) SpO2:  [98 %-99 %] 98 % (12/29 0607)  Intake/Output from previous day: 12/28 0701 - 12/29 0700 In: 1577.2 [P.O.:1275; I.V.:302.2] Out: 1600 [Urine:1600] Intake/Output this shift: No intake/output data recorded.  Recent Labs    03/09/17 0524  HGB 12.8   Recent Labs    03/09/17 0524  WBC 18.7*  RBC 4.56  HCT 38.2  PLT 206   Recent Labs    03/09/17 0524  NA 138  K 3.2*  CL 105  CO2 27  BUN 8  CREATININE 0.80  GLUCOSE 116*  CALCIUM 8.2*   No results for input(s): LABPT, INR in the last 72 hours.  Sensation intact distally Intact pulses distally Dorsiflexion/Plantar flexion intact Incision: dressing C/D/I  Assessment/Plan: 2 Days Post-Op Procedure(s) (LRB): RIGHT TOTAL HIP ARTHROPLASTY ANTERIOR APPROACH (Right) Up with therapy Discharge home with home health today.  Mcarthur Rossetti 03/10/2017, 8:37 AM

## 2017-03-10 NOTE — Progress Notes (Signed)
Occupational Therapy Treatment Patient Details Name: Stacy [name] MRN: [id number] DOB: May 19, 1965 Today's Date: 03/10/2017    History of present illness Pt s/p R THR and with hx of colon CA   OT comments  Performed ADL with AE and shower transfer.  All education completed this session  Follow Up Recommendations  Supervision/Assistance - 24 hour    Equipment Recommendations  None recommended by OT    Recommendations for Other Services      Precautions / Restrictions Precautions Precautions: Fall Precaution Comments: reviewed avoiding excessive/end range hip extension Restrictions Weight Bearing Restrictions: No Other Position/Activity Restrictions: WBAT       Mobility Bed Mobility Overal bed mobility: Needs Assistance             General bed mobility comments: pt up in recliner  Transfers Overall transfer level: Needs assistance Equipment used: Rolling walker (2 wheeled) Transfers: Sit to/from Stand Sit to Stand: Min guard;Supervision         General transfer comment: for safety; cues for UE/LE placement    Balance                                           ADL either performed or assessed with clinical judgement   ADL               Lower Body Bathing: Minimal assistance;Sit to/from stand       Lower Body Dressing: Minimal assistance;With adaptive equipment;Sit to/from stand           Tub/ Shower Transfer: Min guard;3 in 1;Ambulation;Walk-in shower     General ADL Comments: performed bathing and dressing. Used Chiropodist, which she has.  Also practiced shower transfer     Vision       Perception     Praxis      Cognition Arousal/Alertness: Awake/alert Behavior During Therapy: WFL for tasks assessed/performed Overall Cognitive Status: Within Functional Limits for tasks assessed                                          Exercises    Shoulder Instructions       General  Comments      Pertinent Vitals/ Pain       Pain Assessment: 0-10 Pain Score: 4  Pain Location: R hip Pain Descriptors / Indicators: Sore Pain Intervention(s): Limited activity within patient's tolerance;Monitored during session;Premedicated before session;Ice applied  Home Living                                          Prior Functioning/Environment              Frequency           Progress Toward Goals  OT Goals(current goals can now be found in the care plan section)  Progress towards OT goals: Progressing toward goals  Acute Rehab OT Goals Patient Stated Goal: Regain IND   Plan      Co-evaluation                 AM-PAC PT "6 Clicks" Daily Activity     Outcome Measure   Help from another person eating meals?:  None Help from another person taking care of personal grooming?: A Little Help from another person toileting, which includes using toliet, bedpan, or urinal?: A Little Help from another person bathing (including washing, rinsing, drying)?: A Little Help from another person to put on and taking off regular upper body clothing?: A Little Help from another person to put on and taking off regular lower body clothing?: A Little 6 Click Score: 19    End of Session    OT Visit Diagnosis: Pain Pain - Right/Left: Right Pain - part of body: Hip   Activity Tolerance Patient tolerated treatment well   Patient Left in chair;with call bell/phone within reach   Nurse Communication          Time: 0722-5750 OT Time Calculation (min): 33 min  Charges: OT General Charges $OT Visit: 1 Visit OT Treatments $Self Care/Home Management : 23-37 mins  Lesle Chris, OTR/L [phone] 03/10/2017   Stacy [name] 03/10/2017, 12:34 PM

## 2017-03-10 NOTE — Discharge Instructions (Signed)

## 2017-03-10 NOTE — Progress Notes (Signed)
Pt to d/c home. No equipment needs. Patient set up with [facility] for home health PT. AVS reviewed and "My Chart" discussed with pt. Pt capable of verbalizing medications, signs and symptoms of infection, and follow-up appointments. Remains hemodynamically stable. No signs and symptoms of distress. Educated pt to return to ER in the case of SOB, dizziness, or chest pain.

## 2017-03-10 NOTE — Care Management Note (Signed)
51 yo F, s/p R THR. Received referral to assist with HHPT and DME. Met with pt. She plans to return home with the support of her husband. She has a RW, 3-in-1 BSC and a cane. Discussed preference for a [facility] agency. Referral made to [facility] by the physician's office prior to surgery. Pt agrees to use [facility].

## 2017-03-12 ENCOUNTER — Telehealth (INDEPENDENT_AMBULATORY_CARE_PROVIDER_SITE_OTHER): Payer: Self-pay | Admitting: Orthopaedic Surgery

## 2017-03-12 NOTE — Telephone Encounter (Signed)
Verbal order left on VM  

## 2017-03-12 NOTE — Telephone Encounter (Signed)
Stacy [name] with [facility] at [facility] needs PT verbal orders for patient  3x for 2 weeks  CB [phone]

## 2017-03-19 ENCOUNTER — Telehealth (INDEPENDENT_AMBULATORY_CARE_PROVIDER_SITE_OTHER): Payer: Self-pay | Admitting: Orthopaedic Surgery

## 2017-03-19 ENCOUNTER — Other Ambulatory Visit: Payer: BLUE CROSS/BLUE SHIELD

## 2017-03-19 NOTE — Telephone Encounter (Signed)
Patient would like to know if its possible to go ahead and reduce the doses of Oxycodone. Please advise # [phone]

## 2017-03-20 NOTE — Telephone Encounter (Signed)
Called and left message for patient letting her know that she can just take oxycodone as needed. She doesn't have to take it as prescribed. If she wants a completely different medication to call me back

## 2017-03-21 ENCOUNTER — Telehealth (INDEPENDENT_AMBULATORY_CARE_PROVIDER_SITE_OTHER): Payer: Self-pay | Admitting: Orthopaedic Surgery

## 2017-03-21 NOTE — Telephone Encounter (Signed)
Patient called having more questions about her oxycodone dosage, and also is scheduled to have jury duty next week and wondered it you thought that would be a good idea or if she should get an excuse. CB # [phone]

## 2017-03-21 NOTE — Telephone Encounter (Signed)
She definitely needs a note excusing her from jury duty since she is in the immediate postoperative period from having hip replacement surgery.  She needs to be excused from jury duty for at least the next 6 months as she is recovering from surgery.  Find out what she is talking about in terms of her oxycodone dosage and we can have a prescription for her tomorrow if needed.

## 2017-03-21 NOTE — Telephone Encounter (Signed)
Patient questioning if you can write her a note out of jury duty

## 2017-03-23 ENCOUNTER — Encounter (INDEPENDENT_AMBULATORY_CARE_PROVIDER_SITE_OTHER): Payer: Self-pay

## 2017-03-23 NOTE — Telephone Encounter (Signed)
LMOM for patient note ready at front desk  

## 2017-03-26 ENCOUNTER — Encounter (INDEPENDENT_AMBULATORY_CARE_PROVIDER_SITE_OTHER): Payer: Self-pay | Admitting: Orthopaedic Surgery

## 2017-03-26 ENCOUNTER — Ambulatory Visit (INDEPENDENT_AMBULATORY_CARE_PROVIDER_SITE_OTHER): Payer: BLUE CROSS/BLUE SHIELD | Admitting: Orthopaedic Surgery

## 2017-03-26 DIAGNOSIS — Z96641 Presence of right artificial hip joint: Secondary | ICD-10-CM

## 2017-03-26 NOTE — Progress Notes (Signed)
The patient is just over 2 weeks status post a right total hip arthroplasty.  She says she is doing well but she would like to have outpatient physical therapy at [facility] facility is near or on or around [facility].  This is just to work on balance and coordination.  She has no hip precautions.  She is ambulate with a cane and can weight-bear as tolerated.  She is off all pain medications now would like to stop the aspirin daily.  On examination her ligaments are equal.  I removed the old Steri-Strips and dressing in place new Steri-Strips.  There is no significant seroma and some mild swelling only.  At this point should continue increase her activities and we will set her up for outpatient physical therapy.  I will see her back in 4 weeks see how she is doing overall but no x-rays are needed.  All questions concerns were answered and addressed.

## 2017-03-27 ENCOUNTER — Other Ambulatory Visit (INDEPENDENT_AMBULATORY_CARE_PROVIDER_SITE_OTHER): Payer: Self-pay

## 2017-03-27 DIAGNOSIS — Z96641 Presence of right artificial hip joint: Secondary | ICD-10-CM

## 2017-04-03 ENCOUNTER — Ambulatory Visit: Payer: BLUE CROSS/BLUE SHIELD | Attending: Orthopaedic Surgery | Admitting: Physical Therapy

## 2017-04-03 ENCOUNTER — Other Ambulatory Visit: Payer: Self-pay

## 2017-04-03 ENCOUNTER — Encounter: Payer: Self-pay | Admitting: Physical Therapy

## 2017-04-03 DIAGNOSIS — Z96641 Presence of right artificial hip joint: Secondary | ICD-10-CM | POA: Diagnosis present

## 2017-04-03 DIAGNOSIS — M6289 Other specified disorders of muscle: Secondary | ICD-10-CM | POA: Diagnosis present

## 2017-04-03 DIAGNOSIS — M25551 Pain in right hip: Secondary | ICD-10-CM | POA: Diagnosis present

## 2017-04-03 DIAGNOSIS — M6281 Muscle weakness (generalized): Secondary | ICD-10-CM

## 2017-04-03 NOTE — Therapy (Signed)
[address] [place], [place], [address] Phone: [phone]   Fax:  [phone]  Physical Therapy Evaluation  Patient Details  Name: Stacy [name] MRN: [id number] Date of Birth: 06/10/1965 Referring Provider: Ninfa Linden   Encounter Date: 04/03/2017  PT End of Session - 04/03/17 1551    Visit Number  1    Date for PT Re-Evaluation  06/01/17    PT Start Time  1500    PT Stop Time  1549    PT Time Calculation (min)  49 min    Activity Tolerance  Patient tolerated treatment well    Behavior During Therapy  [facility] for tasks assessed/performed       Past Medical History:  Diagnosis Date  . Arthritis    right hip  . Cancer [facility])    sigmoid colon cancer  . Family history of adverse reaction to anesthesia    father:confusion,disorientation  . GERD (gastroesophageal reflux disease)   . Hypertension     Past Surgical History:  Procedure Laterality Date  . adnoidectomy     as child  . LAPAROSCOPIC SIGMOID COLECTOMY N/A 11/09/2016   Procedure: LAPAROSCOPIC SIGMOID COLECTOMY;  Surgeon: Stark Klein, MD;  Location: [facility];  Service: General;  Laterality: N/A;  . MULTIPLE TOOTH EXTRACTIONS     as a child  . MYRINGOTOMY     as a child  . PARTIAL COLECTOMY  11/09/2016   laparoscopic partial colectomy for sigmoid colon cancer  . SIGMOIDOSCOPY  03/01/2017   post-op eval   . TOOTH EXTRACTION    . TOTAL HIP ARTHROPLASTY Right 03/08/2017   Procedure: RIGHT TOTAL HIP ARTHROPLASTY ANTERIOR APPROACH;  Surgeon: Mcarthur Rossetti, MD;  Location: [facility];  Service: Orthopedics;  Laterality: Right;    There were no vitals filed for this visit.   Subjective Assessment - 04/03/17 1606    Subjective  Pt. reports right THR anterior approach about 4 weeks ago. Pt. reports chronic pain in right hip since 2012. Pt. reports no direct MOI or trauma to cause OA of hip. Pt. reports going to the doctor prior to surgery to get  imaging done that showed OA.  Pt. reports having pain in right quad and having to help leg get in and out of the car.  Pt. reports getting up and down difficult and has fear with getting in a low position. Pt. reports using a shower chair when showering due to fearfulness of slipping. Pt. reports being able to tie shoes for the first time today since 4-5 years. Pt. reports getting easily fatigued from walking for a prolonged period of time.    Limitations  Sitting;Walking;Standing    Patient Stated Goals  increase strength and flexibility and ROM    Currently in Pain?  Yes    Pain Score  0-No pain    Pain Location  Hip knee    Pain Orientation  Right;Anterior;Lateral    Pain Descriptors / Indicators  Aching    Pain Type  Acute pain;Surgical pain    Pain Onset  1 to 4 weeks ago    Pain Frequency  Intermittent    Aggravating Factors   getting in and out of the car and bed increases to 2/10    Pain Relieving Factors  sitting down brings down to 0/10         [facility] PT Assessment - 04/03/17 0001      Assessment   Medical Diagnosis  right THR  Referring Provider  Ninfa Linden    Onset Date/Surgical Date  03/06/17    Prior Therapy  home health (5 visits)      Precautions   Precautions  None      Restrictions   Weight Bearing Restrictions  No      Balance Screen   Has the patient fallen in the past 6 months  No    Has the patient had a decrease in activity level because of a fear of falling?   No    Is the patient reluctant to leave their home because of a fear of falling?   No      Home Environment   Additional Comments  taking care of cat, yardwork, housework      Prior Function   Level of Independence  Independent    Vocation  Works at [employer], some lifting, squatting, climbing up and down a ladder    Leisure  riding a bicycle, elliptical      Functional Tests   Functional tests  Single leg stance      Single Leg Squat   Comments  no pain no  loss of balance      ROM / Strength   AROM / PROM / Strength  AROM;PROM;Strength      AROM   Overall AROM Comments  knee ROM WNL    AROM Assessment Site  Hip;Knee    Right/Left Hip  Right    Right Hip Extension  9    Right Hip Flexion  93    Right Hip External Rotation   15    Right Hip Internal Rotation   19    Right Hip ABduction  18 pain unsupported     Right/Left Knee  Right      PROM   Overall PROM Comments  --    PROM Assessment Site  Knee;Hip    Right/Left Hip  Right    Right Hip External Rotation   21    Right Hip Internal Rotation   26    Right/Left Knee  Right      Strength   Strength Assessment Site  Knee;Hip    Right/Left Hip  Right    Right Hip Flexion  4/5 with pain anterior thigh    Right Hip Extension  4+/5    Right Hip External Rotation   3+/5 with pain    Right Hip Internal Rotation  4-/5 with pain    Right Hip ABduction  4-/5 pain lateral leg and near GT    Right Hip ADduction  4/5    Right/Left Knee  Right    Right Knee Flexion  5/5    Right Knee Extension  5/5      Flexibility   Soft Tissue Assessment /Muscle Length  yes    Hamstrings  tight    Quadriceps  tight    ITB  tight and brings on discomfort    Piriformis  tight and brings discomfort to ant hip      Palpation   Palpation comment  TTP around GT area and slightly inferior, some tightness in ITB but no complaints, some tightness through piriformis      Standardized Balance Assessment   Standardized Balance Assessment  Timed Up and Go Test      Timed Up and Go Test   Normal TUG (seconds)  12             Objective measurements completed  on examination: See above findings.      [facility] Adult PT Treatment/Exercise - 04/03/17 0001      Ambulation/Gait   Gait Comments  slight antalgic gait and goes up onto R toes with push off greater than the L             PT Education - 04/03/17 1551    Education provided  Yes    Education Details  HEP, hip exercises    Person(s)  Educated  Patient    Methods  Explanation;Demonstration;Tactile cues;Verbal cues    Comprehension  Verbalized understanding;Returned demonstration       PT Short Term Goals - 04/03/17 1559      PT SHORT TERM GOAL #1   Title  independent with HEP    Time  2    Period  Weeks    Status  New        PT Long Term Goals - 04/03/17 1559      PT LONG TERM GOAL #1   Title  reports decrease in fear of transitional movements for QOL    Time  8    Period  Weeks    Status  New      PT LONG TERM GOAL #2   Title  reports normal exercise routine with no pain or limitation for functional use and QOL    Time  8    Period  Weeks    Status  New      PT LONG TERM GOAL #3   Title  increase gross hip strength to 4+/5 for functional use    Time  8    Period  Weeks    Status  New      PT LONG TERM GOAL #4   Title  increase hip IR/ER by 20 deg for functional use     Time  8    Period  Weeks    Status  New             Plan - 04/03/17 1552    Clinical Impression Statement  Pt. presented with minimal hip pain and had c/o pain in the quad and behind the knee. Pt. had very tight hamstrings, ITB, and piriformis. TTP along lateral hip in GT area and slightly inferior. Pt. transitions well but struggles to lift left up without support (i.e. to get into bed or onto plynth). Pt. demonstrates hesitations and fear of movement which she stated was due to being in pain for so long. Pt. seems very receptive to PT but does show fear for movement.     Clinical Presentation  Evolving    Clinical Presentation due to:  s/p right THR    Clinical Decision Making  Low    Rehab Potential  Good    PT Frequency  2x / week    PT Duration  8 weeks    PT Treatment/Interventions  Electrical Stimulation;Patient/family education;Therapeutic activities;Therapeutic exercise;Moist Heat;Cryotherapy    PT Next Visit Plan  begin hip strengthening    PT Home Exercise Plan  stretching: piriformis, hamstring, ITB     Consulted and Agree with Plan of Care  Patient       Patient will benefit from skilled therapeutic intervention in order to improve the following deficits and impairments:  Abnormal gait, Difficulty walking, Decreased strength, Decreased range of motion, Impaired flexibility, Increased muscle spasms  Visit Diagnosis: Status post total replacement of right hip  Pain in right hip  Muscle weakness (generalized)  Muscle tightness  Problem List Patient Active Problem List   Diagnosis Date Noted  . Status post total replacement of right hip 03/08/2017  . Cancer of sigmoid colon ([place]) 11/09/2016  . Pain of right hip joint 09/19/2016  . Unilateral primary osteoarthritis, right hip 09/19/2016    Juliann Pulse SPT 04/03/2017, 4:12 PM  [address] [place], [place], [address] Phone: [phone]   Fax:  [phone]  Name: Mechel Haggard MRN: [id number] Date of Birth: 08-06-1965

## 2017-04-10 ENCOUNTER — Ambulatory Visit: Payer: BLUE CROSS/BLUE SHIELD | Admitting: Physical Therapy

## 2017-04-10 DIAGNOSIS — M6281 Muscle weakness (generalized): Secondary | ICD-10-CM

## 2017-04-10 DIAGNOSIS — M25551 Pain in right hip: Secondary | ICD-10-CM

## 2017-04-10 DIAGNOSIS — Z96641 Presence of right artificial hip joint: Secondary | ICD-10-CM | POA: Diagnosis not present

## 2017-04-10 DIAGNOSIS — M6289 Other specified disorders of muscle: Secondary | ICD-10-CM

## 2017-04-10 NOTE — Therapy (Signed)
[address] [place], [place], [address] Phone: [phone]   Fax:  [phone]  Physical Therapy Treatment  Patient Details  Name: Stacy [name] MRN: [id number] Date of Birth: 04-10-1965 Referring Provider: Ninfa Linden   Encounter Date: 04/10/2017  PT End of Session - 04/10/17 1052    Visit Number  2    Date for PT Re-Evaluation  06/01/17    PT Start Time  4562    PT Stop Time  1111    PT Time Calculation (min)  56 min       Past Medical History:  Diagnosis Date  . Arthritis    right hip  . Cancer [facility])    sigmoid colon cancer  . Family history of adverse reaction to anesthesia    father:confusion,disorientation  . GERD (gastroesophageal reflux disease)   . Hypertension     Past Surgical History:  Procedure Laterality Date  . adnoidectomy     as child  . LAPAROSCOPIC SIGMOID COLECTOMY N/A 11/09/2016   Procedure: LAPAROSCOPIC SIGMOID COLECTOMY;  Surgeon: Stark Klein, MD;  Location: [facility];  Service: General;  Laterality: N/A;  . MULTIPLE TOOTH EXTRACTIONS     as a child  . MYRINGOTOMY     as a child  . PARTIAL COLECTOMY  11/09/2016   laparoscopic partial colectomy for sigmoid colon cancer  . SIGMOIDOSCOPY  03/01/2017   post-op eval   . TOOTH EXTRACTION    . TOTAL HIP ARTHROPLASTY Right 03/08/2017   Procedure: RIGHT TOTAL HIP ARTHROPLASTY ANTERIOR APPROACH;  Surgeon: Mcarthur Rossetti, MD;  Location: [facility];  Service: Orthopedics;  Laterality: Right;    There were no vitals filed for this visit.  Subjective Assessment - 04/10/17 1016    Subjective  did stretches some, then increased walking so stretches where harder. mornings are worse then afternoons- so just getting loose    Pain Score  2     Pain Location  Hip    Pain Orientation  Right                      [facility] Adult PT Treatment/Exercise - 04/10/17 0001      Exercises   Exercises  Knee/Hip      Knee/Hip  Exercises: Aerobic   Nustep  L 4 6 min      Knee/Hip Exercises: Machines for Strengthening   Cybex Knee Extension  5# 2 sets 8    Cybex Knee Flexion  15# 2 sets 10      Knee/Hip Exercises: Seated   Ball Squeeze  20    Marching  Strengthening;2 sets;10 reps;Right red tband    Abduction/Adduction   Strengthening;Both;2 sets;15 reps red tband      Knee/Hip Exercises: Supine   Straight Leg Raises  Strengthening;Right;10 reps    Straight Leg Raises Limitations  SLR with abd 10    Other Supine Knee/Hip Exercises  bridge,KTC and obl with ball 15 times      Modalities   Modalities  Electrical Stimulation;Moist Heat      Moist Heat Therapy   Number Minutes Moist Heat  15 Minutes    Moist Heat Location  Hip      Electrical Stimulation   Electrical Stimulation Location  RT hip/quad    Electrical Stimulation Action  IFC    Electrical Stimulation Parameters  supine    Electrical Stimulation Goals  Pain      Manual Therapy   Manual  Therapy  Passive ROM    Manual therapy comments  STM to RT quad and ITB with stretching    Passive ROM  RT hip( HS,quad,ITB and piriformis)               PT Short Term Goals - 04/03/17 1559      PT SHORT TERM GOAL #1   Title  independent with HEP    Time  2    Period  Weeks    Status  New        PT Long Term Goals - 04/03/17 1559      PT LONG TERM GOAL #1   Title  reports decrease in fear of transitional movements for QOL    Time  8    Period  Weeks    Status  New      PT LONG TERM GOAL #2   Title  reports normal exercise routine with no pain or limitation for functional use and QOL    Time  8    Period  Weeks    Status  New      PT LONG TERM GOAL #3   Title  increase gross hip strength to 4+/5 for functional use    Time  8    Period  Weeks    Status  New      PT LONG TERM GOAL #4   Title  increase hip IR/ER by 20 deg for functional use     Time  8    Period  Weeks    Status  New            Plan - 04/10/17 1052     Clinical Impression Statement  pt tolerated ther ex with cuing for correct muscle activation. tenderness and pain with stretches and STW but verb it does help after    PT Treatment/Interventions  Electrical Stimulation;Patient/family education;Therapeutic activities;Therapeutic exercise;Moist Heat;Cryotherapy    PT Next Visit Plan  assess today sessions and progress from there. increase HEP       Patient will benefit from skilled therapeutic intervention in order to improve the following deficits and impairments:  Abnormal gait, Difficulty walking, Decreased strength, Decreased range of motion, Impaired flexibility, Increased muscle spasms  Visit Diagnosis: Status post total replacement of right hip  Pain in right hip  Muscle weakness (generalized)  Muscle tightness     Problem List Patient Active Problem List   Diagnosis Date Noted  . Status post total replacement of right hip 03/08/2017  . Cancer of sigmoid colon ([place]) 11/09/2016  . Pain of right hip joint 09/19/2016  . Unilateral primary osteoarthritis, right hip 09/19/2016    PAYSEUR,ANGIE PTA 04/10/2017, 10:55 AM  [address] [place], [place], [address] Phone: [phone]   Fax:  [phone]  Name: Stacy [name] MRN: [id number] Date of Birth: February 13, 1966

## 2017-04-12 ENCOUNTER — Ambulatory Visit: Payer: BLUE CROSS/BLUE SHIELD | Admitting: Physical Therapy

## 2017-04-12 ENCOUNTER — Encounter: Payer: Self-pay | Admitting: Physical Therapy

## 2017-04-12 DIAGNOSIS — Z96641 Presence of right artificial hip joint: Secondary | ICD-10-CM

## 2017-04-12 DIAGNOSIS — M25551 Pain in right hip: Secondary | ICD-10-CM

## 2017-04-12 DIAGNOSIS — M6281 Muscle weakness (generalized): Secondary | ICD-10-CM

## 2017-04-12 DIAGNOSIS — M6289 Other specified disorders of muscle: Secondary | ICD-10-CM

## 2017-04-12 NOTE — Therapy (Signed)
[place], [place], [address] Phone: [phone]   Fax:  [phone]  Physical Therapy Treatment  Patient Details  Name: Stacy [name] MRN: [id number] Date of Birth: 07-13-1965 Referring Provider: Ninfa Linden   Encounter Date: 04/12/2017  PT End of Session - 04/12/17 5462    Visit Number  3    Date for PT Re-Evaluation  06/01/17    PT Start Time  1450    PT Stop Time  1546    PT Time Calculation (min)  56 min    Activity Tolerance  Patient tolerated treatment well    Behavior During Therapy  [facility] for tasks assessed/performed       Past Medical History:  Diagnosis Date  . Arthritis    right hip  . Cancer [facility])    sigmoid colon cancer  . Family history of adverse reaction to anesthesia    father:confusion,disorientation  . GERD (gastroesophageal reflux disease)   . Hypertension     Past Surgical History:  Procedure Laterality Date  . adnoidectomy     as child  . LAPAROSCOPIC SIGMOID COLECTOMY N/A 11/09/2016   Procedure: LAPAROSCOPIC SIGMOID COLECTOMY;  Surgeon: Stark Klein, MD;  Location: [place];  Service: General;  Laterality: N/A;  . MULTIPLE TOOTH EXTRACTIONS     as a child  . MYRINGOTOMY     as a child  . PARTIAL COLECTOMY  11/09/2016   laparoscopic partial colectomy for sigmoid colon cancer  . SIGMOIDOSCOPY  03/01/2017   post-op eval   . TOOTH EXTRACTION    . TOTAL HIP ARTHROPLASTY Right 03/08/2017   Procedure: RIGHT TOTAL HIP ARTHROPLASTY ANTERIOR APPROACH;  Surgeon: Mcarthur Rossetti, MD;  [facility];  Service: Orthopedics;  Laterality: Right;    There were no vitals filed for this visit.  Subjective Assessment - 04/12/17 1448    Subjective  Pt. felt a little sore/stiff after the last treatment. Pt. reports exercises and stretches at home are going well.     Currently in Pain?  Yes    Pain Score  1     Pain Location  Hip    Pain Orientation  Right    Pain  Descriptors / Indicators  Aching                      [facility] Adult PT Treatment/Exercise - 04/12/17 0001      Knee/Hip Exercises: Aerobic   Stationary Bike  L1 x 6 mins      Knee/Hip Exercises: Machines for Strengthening   Cybex Knee Extension  5# 2x10    Cybex Knee Flexion  15# 3x10      Knee/Hip Exercises: Standing   Hip Abduction  Stengthening;Right;2 sets;10 reps;Limitations    Abduction Limitations  3#    Hip Extension  Stengthening;Right;2 sets;10 reps;Limitations    Extension Limitations  3#    Forward Step Up  Right;10 reps;Step Height: 8";Limitations;1 set    Forward Step Up Limitations  3#      Knee/Hip Exercises: Seated   Ball Squeeze  20      Knee/Hip Exercises: Supine   Straight Leg Raises  Strengthening;Right;10 reps very difficult for pt.; 2x10    Other Supine Knee/Hip Exercises  bridges with ball 2x10      Knee/Hip Exercises: Sidelying   Hip ABduction  Strengthening;Right;2 sets;10 reps;Limitations    Hip ABduction Limitations  3#      Modalities   Modalities  Electrical Stimulation      Moist Heat Therapy   Number Minutes Moist Heat  15 Minutes    Moist Heat Location  Hip      Electrical Stimulation   Electrical Stimulation Location  RT hip/quad    Electrical Stimulation Action  IFC    Electrical Stimulation Parameters  dupine    Electrical Stimulation Goals  Pain      Manual Therapy   Manual Therapy  Passive ROM    Passive ROM  RT hip( HS,quad,ITB and piriformis) very tight quad and ITB             PT Education - 04/12/17 1532    Education provided  Yes    Education Details  quad stretch    Person(s) Educated  Patient    Methods  Explanation;Demonstration    Comprehension  Verbalized understanding       PT Short Term Goals - 04/12/17 1536      PT SHORT TERM GOAL #1   Title  independent with HEP    Time  2    Period  Weeks    Status  On-going        PT Long Term Goals - 04/03/17 1559      PT LONG TERM GOAL #1    Title  reports decrease in fear of transitional movements for QOL    Time  8    Period  Weeks    Status  New      PT LONG TERM GOAL #2   Title  reports normal exercise routine with no pain or limitation for functional use and QOL    Time  8    Period  Weeks    Status  New      PT LONG TERM GOAL #3   Title  increase gross hip strength to 4+/5 for functional use    Time  8    Period  Weeks    Status  New      PT LONG TERM GOAL #4   Title  increase hip IR/ER by 20 deg for functional use     Time  8    Period  Weeks    Status  New            Plan - 04/12/17 1533    Clinical Impression Statement  Pt. tolerated treatment well but had difficulty with SLR, bridges with exercise ball and forward step up. Pt. needed cues to correct form and some tactil cues for correct muscle activation. Pt. was very tight in ITB and quad.     Rehab Potential  Good    PT Frequency  2x / week    PT Duration  8 weeks    PT Treatment/Interventions  Electrical Stimulation;Patient/family education;Therapeutic activities;Therapeutic exercise;Moist Heat;Cryotherapy    PT Next Visit Plan  practice stairs, progress hip ex as tolerated    PT Home Exercise Plan  quad stretch, hip flex/ABD/ex    Consulted and Agree with Plan of Care  Patient       Patient will benefit from skilled therapeutic intervention in order to improve the following deficits and impairments:  Abnormal gait, Difficulty walking, Decreased strength, Decreased range of motion, Impaired flexibility, Increased muscle spasms  Visit Diagnosis: Status post total replacement of right hip  Pain in right hip  Muscle weakness (generalized)  Muscle tightness     Problem List Patient Active Problem List   Diagnosis Date Noted  . Status post total replacement of  right hip 03/08/2017  . Cancer of sigmoid colon ([place]) 11/09/2016  . Pain of right hip joint 09/19/2016  . Unilateral primary osteoarthritis, right hip 09/19/2016    Stacy [name] SPT 04/12/2017, 3:38 PM  [place], [place], [address] Phone: [phone]   Fax:  [phone]  Name: Stacy [name] MRN: [id number] Date of Birth: 11/08/1965

## 2017-04-16 ENCOUNTER — Ambulatory Visit
Admission: RE | Admit: 2017-04-16 | Discharge: 2017-04-16 | Disposition: A | Discharge status: Home / Self Care | Payer: BLUE CROSS/BLUE SHIELD | Source: Ambulatory Visit | Attending: Internal Medicine | Admitting: Internal Medicine

## 2017-04-16 ENCOUNTER — Other Ambulatory Visit: Payer: Self-pay | Admitting: Internal Medicine

## 2017-04-16 DIAGNOSIS — N63 Unspecified lump in unspecified breast: Secondary | ICD-10-CM

## 2017-04-16 DIAGNOSIS — N6001 Solitary cyst of right breast: Secondary | ICD-10-CM

## 2017-04-17 ENCOUNTER — Ambulatory Visit: Payer: BLUE CROSS/BLUE SHIELD | Attending: Orthopaedic Surgery | Admitting: Physical Therapy

## 2017-04-17 ENCOUNTER — Encounter: Payer: Self-pay | Admitting: Physical Therapy

## 2017-04-17 DIAGNOSIS — M25551 Pain in right hip: Secondary | ICD-10-CM | POA: Insufficient documentation

## 2017-04-17 DIAGNOSIS — M6289 Other specified disorders of muscle: Secondary | ICD-10-CM | POA: Diagnosis present

## 2017-04-17 DIAGNOSIS — Z96641 Presence of right artificial hip joint: Secondary | ICD-10-CM | POA: Diagnosis not present

## 2017-04-17 DIAGNOSIS — M6281 Muscle weakness (generalized): Secondary | ICD-10-CM | POA: Diagnosis present

## 2017-04-17 NOTE — Therapy (Signed)
[place] [address] [place] [address] [place], [place], [address] Phone: [phone]   Fax:  [phone]  Physical Therapy Treatment  Patient Details  Name: Stacy [name] MRN: [id number] Date of Birth: 05/17/1965 Referring Provider: Ninfa Linden   Encounter Date: 04/17/2017  PT End of Session - 04/17/17 1058    Visit Number  4    Date for PT Re-Evaluation  06/01/17    PT Start Time  5176    PT Stop Time  1110    PT Time Calculation (min)  55 min    Activity Tolerance  Patient tolerated treatment well    Behavior During Therapy  [facility] for tasks assessed/performed       Past Medical History:  Diagnosis Date  . Arthritis    right hip  . Cancer [facility])    sigmoid colon cancer  . Family history of adverse reaction to anesthesia    father:confusion,disorientation  . GERD (gastroesophageal reflux disease)   . Hypertension     Past Surgical History:  Procedure Laterality Date  . adnoidectomy     as child  . LAPAROSCOPIC SIGMOID COLECTOMY N/A 11/09/2016   Procedure: LAPAROSCOPIC SIGMOID COLECTOMY;  Surgeon: Stark Klein, MD;  Location: [place];  Service: General;  Laterality: N/A;  . MULTIPLE TOOTH EXTRACTIONS     as a child  . MYRINGOTOMY     as a child  . PARTIAL COLECTOMY  11/09/2016   laparoscopic partial colectomy for sigmoid colon cancer  . SIGMOIDOSCOPY  03/01/2017   post-op eval   . TOOTH EXTRACTION    . TOTAL HIP ARTHROPLASTY Right 03/08/2017   Procedure: RIGHT TOTAL HIP ARTHROPLASTY ANTERIOR APPROACH;  Surgeon: Mcarthur Rossetti, MD;  [facility];  Service: Orthopedics;  Laterality: Right;    There were no vitals filed for this visit.  Subjective Assessment - 04/17/17 1015    Subjective  Pt. reports feeling 'fine' after the last treatment. Pt. reports liking the e-stim and heat after last tx and thinking it was helpful. Pt. reports sometimes having difficulty putting on R sock due ot stiffness. Pt. reports HEP  stretch is helpful and going well and has noticed it slowly start to get a little easier.    Currently in Pain?  Yes    Pain Score  1     Pain Location  Hip    Pain Orientation  Right                      [facility] Adult PT Treatment/Exercise - 04/17/17 0001      Ambulation/Gait   Gait Comments  step over step up descending and ascending steps      Knee/Hip Exercises: Aerobic   Stationary Bike  L1 x 6 mins      Knee/Hip Exercises: Standing   Hip Abduction  Stengthening;Right;2 sets;10 reps;Limitations    Abduction Limitations  3#    Hip Extension  Stengthening;Right;2 sets;10 reps;Limitations    Extension Limitations  3#    Lateral Step Up  Right;2 sets;10 reps;Step Height: 6";Limitations    Lateral Step Up Limitations  3#    Forward Step Up  Right;2 sets;10 reps;Step Height: 6";Limitations    Forward Step Up Limitations  3#      Knee/Hip Exercises: Seated   Ball Squeeze  20      Knee/Hip Exercises: Supine   Straight Leg Raises  Strengthening;Right;2 sets;15 reps    Other Supine Knee/Hip Exercises  bridges with  ABD red tband 2x10      Knee/Hip Exercises: Sidelying   Hip ABduction  Strengthening;Right;Limitations;3 sets;10 reps    Hip ABduction Limitations  3#      Modalities   Modalities  Electrical Stimulation      Moist Heat Therapy   Number Minutes Moist Heat  15 Minutes    Moist Heat Location  Hip      Electrical Stimulation   Electrical Stimulation Location  RT hip/quad    Electrical Stimulation Action  IFC    Electrical Stimulation Parameters  supine    Electrical Stimulation Goals  Pain      Manual Therapy   Manual Therapy  Passive ROM    Passive ROM  RT hip( HS,quad,ITB and piriformis)               PT Short Term Goals - 04/12/17 1536      PT SHORT TERM GOAL #1   Title  independent with HEP    Time  2    Period  Weeks    Status  On-going        PT Long Term Goals - 04/17/17 1104      PT LONG TERM GOAL #5   Title  report  no difficulty or pain donning/doffing socks for functional use    Time  8    Period  Weeks    Status  New            Plan - 04/17/17 1059    Clinical Impression Statement  Pt. tolerated tx well but has difficulty with sidelying ABD and SLR due to lots of weakness. Pt. still walks with slight antalgic gait with decreased hip/knee flexion. Pt. had difficulty and some discomfort ascending stels when fully WB through R hip. Pt. helps pick up leg when having to lift it up onto a bed and bring down off table to go back into a seated position. Talked to pt. about being more cognizant of trying to do things as normally as possible to activate those hip muscles and let them do the work. Pt. hip musculature is very tight especially quad flexibility.    Rehab Potential  Good    PT Frequency  2x / week    PT Duration  8 weeks    PT Treatment/Interventions  Electrical Stimulation;Patient/family education;Therapeutic activities;Therapeutic exercise;Moist Heat;Cryotherapy    PT Next Visit Plan  continue hip strengthening as tolerated    Consulted and Agree with Plan of Care  Patient       Patient will benefit from skilled therapeutic intervention in order to improve the following deficits and impairments:  Abnormal gait, Difficulty walking, Decreased strength, Decreased range of motion, Impaired flexibility, Increased muscle spasms  Visit Diagnosis: Status post total replacement of right hip  Pain in right hip  Muscle weakness (generalized)  Muscle tightness     Problem List Patient Active Problem List   Diagnosis Date Noted  . Status post total replacement of right hip 03/08/2017  . Cancer of sigmoid colon ([place]) 11/09/2016  . Pain of right hip joint 09/19/2016  . Unilateral primary osteoarthritis, right hip 09/19/2016   During this treatment session, the therapist was present, participating in and directing the treatment.  Juliann Pulse SPT 04/17/2017, 11:15 AM  [place] [address] [place], [place], [address] Phone: [phone]   Fax:  [phone]  Name: Stacy [name] MRN: [id number] Date of Birth: 1965/12/19

## 2017-04-20 ENCOUNTER — Ambulatory Visit: Payer: BLUE CROSS/BLUE SHIELD | Admitting: Physical Therapy

## 2017-04-20 ENCOUNTER — Encounter: Payer: Self-pay | Admitting: Physical Therapy

## 2017-04-20 DIAGNOSIS — Z96641 Presence of right artificial hip joint: Secondary | ICD-10-CM | POA: Diagnosis not present

## 2017-04-20 DIAGNOSIS — M25551 Pain in right hip: Secondary | ICD-10-CM

## 2017-04-20 DIAGNOSIS — M6289 Other specified disorders of muscle: Secondary | ICD-10-CM

## 2017-04-20 DIAGNOSIS — M6281 Muscle weakness (generalized): Secondary | ICD-10-CM

## 2017-04-20 NOTE — Therapy (Signed)
[place], [place], [address] Phone: [phone]   Fax:  [phone]  Physical Therapy Treatment  Patient Details  Name: Stacy [name] MRN: [id number] Date of Birth: January 11, 1966 Referring Provider: Ninfa Linden   Encounter Date: 04/20/2017  PT End of Session - 04/20/17 1008    Visit Number  5    Date for PT Re-Evaluation  06/01/17    PT Start Time  0930    PT Stop Time  1025    PT Time Calculation (min)  55 min    Activity Tolerance  Patient tolerated treatment well    Behavior During Therapy  [facility] for tasks assessed/performed       Past Medical History:  Diagnosis Date  . Arthritis    right hip  . Cancer [facility])    sigmoid colon cancer  . Family history of adverse reaction to anesthesia    father:confusion,disorientation  . GERD (gastroesophageal reflux disease)   . Hypertension     Past Surgical History:  Procedure Laterality Date  . adnoidectomy     as child  . LAPAROSCOPIC SIGMOID COLECTOMY N/A 11/09/2016   Procedure: LAPAROSCOPIC SIGMOID COLECTOMY;  Surgeon: Stark Klein, MD;  Location: [place];  Service: General;  Laterality: N/A;  . MULTIPLE TOOTH EXTRACTIONS     as a child  . MYRINGOTOMY     as a child  . PARTIAL COLECTOMY  11/09/2016   laparoscopic partial colectomy for sigmoid colon cancer  . SIGMOIDOSCOPY  03/01/2017   post-op eval   . TOOTH EXTRACTION    . TOTAL HIP ARTHROPLASTY Right 03/08/2017   Procedure: RIGHT TOTAL HIP ARTHROPLASTY ANTERIOR APPROACH;  Surgeon: Mcarthur Rossetti, MD;  [facility];  Service: Orthopedics;  Laterality: Right;    There were no vitals filed for this visit.  Subjective Assessment - 04/20/17 0936    Subjective  "Just stiff and sore"    Currently in Pain?  Yes    Pain Score  2     Pain Location  Hip    Pain Orientation  Right                      [facility] Adult PT Treatment/Exercise - 04/20/17 0001      Ambulation/Gait   Stairs  Yes    Stairs Assistance  6: Modified independent (Device/Increase time)    Number of Stairs  30    Height of Stairs  6      Knee/Hip Exercises: Aerobic   Stationary Bike  L1 x 6 mins      Knee/Hip Exercises: Machines for Strengthening   Cybex Knee Extension  5lb 2x10     Cybex Knee Flexion  20lb 2x10       Knee/Hip Exercises: Standing   Lateral Step Up  Both;1 set;10 reps;Step Height: 6"    Walking with Sports Cord  30lb 4 way x3 each    Other Standing Knee Exercises  Standing march 2x10       Knee/Hip Exercises: Seated   Ball Squeeze  20      Modalities   Modalities  Electrical Stimulation      Moist Heat Therapy   Number Minutes Moist Heat  15 Minutes    Moist Heat Location  Hip      Electrical Stimulation   Electrical Stimulation Location  RT hip/quad    Electrical Stimulation Action  IFC    Electrical Stimulation Parameters  supine  Electrical Stimulation Goals  Pain               PT Short Term Goals - 04/20/17 1010      PT SHORT TERM GOAL #1   Title  independent with HEP    Status  Achieved        PT Long Term Goals - 04/20/17 1010      PT LONG TERM GOAL #1   Title  reports decrease in fear of transitional movements for QOL    Status  Partially Met      PT LONG TERM GOAL #2   Title  reports normal exercise routine with no pain or limitation for functional use and QOL    Status  On-going      PT LONG TERM GOAL #3   Title  increase gross hip strength to 4+/5 for functional use    Status  On-going            Plan - 04/20/17 1008    Clinical Impression Statement  Pt with some increase fatigue with more functional activities. Some initial instability with resisted sport cord walking. Able to perform standing march with 90 degrees hip and knee flexion. Postural cues needed with stair negotiation.    Rehab Potential  Good    PT Frequency  2x / week    PT Duration  8 weeks    PT Treatment/Interventions   Electrical Stimulation;Patient/family education;Therapeutic activities;Therapeutic exercise;Moist Heat;Cryotherapy    PT Next Visit Plan  continue hip strengthening as tolerated       Patient will benefit from skilled therapeutic intervention in order to improve the following deficits and impairments:  Abnormal gait, Difficulty walking, Decreased strength, Decreased range of motion, Impaired flexibility, Increased muscle spasms  Visit Diagnosis: Status post total replacement of right hip  Pain in right hip  Muscle weakness (generalized)  Muscle tightness     Problem List Patient Active Problem List   Diagnosis Date Noted  . Status post total replacement of right hip 03/08/2017  . Cancer of sigmoid colon ([place]) 11/09/2016  . Pain of right hip joint 09/19/2016  . Unilateral primary osteoarthritis, right hip 09/19/2016    Scot Jun, PTA 04/20/2017, 10:10 AM  [place], [place], [address] Phone: [phone]   Fax:  [phone]  Name: Stacy [name] MRN: [id number] Date of Birth: [birth year]

## 2017-04-24 ENCOUNTER — Ambulatory Visit: Payer: BLUE CROSS/BLUE SHIELD | Admitting: Physical Therapy

## 2017-04-24 ENCOUNTER — Encounter: Payer: Self-pay | Admitting: Physical Therapy

## 2017-04-24 DIAGNOSIS — M25551 Pain in right hip: Secondary | ICD-10-CM

## 2017-04-24 DIAGNOSIS — Z96641 Presence of right artificial hip joint: Secondary | ICD-10-CM | POA: Diagnosis not present

## 2017-04-24 DIAGNOSIS — M6281 Muscle weakness (generalized): Secondary | ICD-10-CM

## 2017-04-24 DIAGNOSIS — M6289 Other specified disorders of muscle: Secondary | ICD-10-CM

## 2017-04-24 NOTE — Therapy (Signed)
[name] [place], [place], [address] Phone: [phone]   Fax:  [phone]  Physical Therapy Treatment  Patient Details  Name: Stacy [name] MRN: [id number] Date of Birth: Apr 20, 1965 Referring Provider: Ninfa Linden   Encounter Date: 04/24/2017  PT End of Session - 04/24/17 1526    Visit Number  6    Date for PT Re-Evaluation  06/01/17    PT Start Time  1450    PT Stop Time  1540    PT Time Calculation (min)  50 min    Activity Tolerance  Patient tolerated treatment well    Behavior During Therapy  [facility] for tasks assessed/performed       Past Medical History:  Diagnosis Date  . Arthritis    right hip  . Cancer [facility])    sigmoid colon cancer  . Family history of adverse reaction to anesthesia    father:confusion,disorientation  . GERD (gastroesophageal reflux disease)   . Hypertension     Past Surgical History:  Procedure Laterality Date  . adnoidectomy     as child  . LAPAROSCOPIC SIGMOID COLECTOMY N/A 11/09/2016   Procedure: LAPAROSCOPIC SIGMOID COLECTOMY;  Surgeon: Stark Klein, MD;  Location: [place];  Service: General;  Laterality: N/A;  . MULTIPLE TOOTH EXTRACTIONS     as a child  . MYRINGOTOMY     as a child  . PARTIAL COLECTOMY  11/09/2016   laparoscopic partial colectomy for sigmoid colon cancer  . SIGMOIDOSCOPY  03/01/2017   post-op eval   . TOOTH EXTRACTION    . TOTAL HIP ARTHROPLASTY Right 03/08/2017   Procedure: RIGHT TOTAL HIP ARTHROPLASTY ANTERIOR APPROACH;  Surgeon: Mcarthur Rossetti, MD;  [facility];  Service: Orthopedics;  Laterality: Right;    There were no vitals filed for this visit.  Subjective Assessment - 04/24/17 1449    Subjective  Pt. reports still being stiff. Pt. reports trying to do HEP more often and consistently and quad stretch has been helping a lot.    Currently in Pain?  Yes    Pain Score  0-No pain                       [facility] Adult PT Treatment/Exercise - 04/24/17 0001      Knee/Hip Exercises: Aerobic   Stationary Bike  L1 x 6 mins (3 fwd/3 back)      Knee/Hip Exercises: Machines for Strengthening   Cybex Knee Extension  10# 2x10    Cybex Knee Flexion  25# 2x10     Cybex Leg Press  20# 2x10      Knee/Hip Exercises: Standing   Lateral Step Up  Right;2 sets;10 reps;Step Height: 8" explosive    Lateral Step Up Limitations  3#    Forward Step Up  Right;2 sets;10 reps;Step Height: 8" explosive    Forward Step Up Limitations  3#    Walking with Sports Cord  30# 4 way x3 each some difficulty with ecc phase with side stepping    Gait Training         Knee/Hip Exercises: Seated   Sit to Sand  2 sets;10 reps;without UE support with yellow wt ball       Modalities   Modalities  Electrical Stimulation      Moist Heat Therapy   Number Minutes Moist Heat  15 Minutes    Moist Heat Location  Hip      Electrical  Stimulation   Electrical Stimulation Location  RT hip/quad    Electrical Stimulation Action  IFC    Electrical Stimulation Parameters  supine    Electrical Stimulation Goals  Pain      Manual Therapy   Manual Therapy  Passive ROM    Passive ROM  RT hip( HS,quad,ITB and piriformis)               PT Short Term Goals - 04/20/17 1010      PT SHORT TERM GOAL #1   Title  independent with HEP    Status  Achieved        PT Long Term Goals - 04/20/17 1010      PT LONG TERM GOAL #1   Title  reports decrease in fear of transitional movements for QOL    Status  Partially Met      PT LONG TERM GOAL #2   Title  reports normal exercise routine with no pain or limitation for functional use and QOL    Status  On-going      PT LONG TERM GOAL #3   Title  increase gross hip strength to 4+/5 for functional use    Status  On-going            Plan - 04/24/17 1528    Clinical Impression Statement  Pt. tolerated tx well with no increase in pain. Pt. did  well with forwrad and lateral step ups on 8" step. Pt. had some difficulty with ecc phase of side step with sport cord. Pt. had more normal gait today only slightly antalgic but less swaying and had better knee/hip flexion.     Rehab Potential  Good    PT Frequency  2x / week    PT Duration  8 weeks    PT Treatment/Interventions  Electrical Stimulation;Patient/family education;Therapeutic activities;Therapeutic exercise;Moist Heat;Cryotherapy    PT Next Visit Plan  continue hip strengthening as tolerated    Consulted and Agree with Plan of Care  Patient       Patient will benefit from skilled therapeutic intervention in order to improve the following deficits and impairments:  Abnormal gait, Difficulty walking, Decreased strength, Decreased range of motion, Impaired flexibility, Increased muscle spasms  Visit Diagnosis: Status post total replacement of right hip  Pain in right hip  Muscle weakness (generalized)  Muscle tightness     Problem List Patient Active Problem List   Diagnosis Date Noted  . Status post total replacement of right hip 03/08/2017  . Cancer of sigmoid colon ([place]) 11/09/2016  . Pain of right hip joint 09/19/2016  . Unilateral primary osteoarthritis, right hip 09/19/2016    Juliann Pulse SPT 04/24/2017, 3:31 PM  [name], [place], [address] Phone: [phone]   Fax:  [phone]  Name: Stacy [name] MRN: [id number] Date of Birth: Jul 13, 1965  During this treatment session, the therapist was present, participating in and directing the treatment.

## 2017-04-26 ENCOUNTER — Ambulatory Visit (INDEPENDENT_AMBULATORY_CARE_PROVIDER_SITE_OTHER): Payer: BLUE CROSS/BLUE SHIELD | Admitting: Orthopaedic Surgery

## 2017-04-26 ENCOUNTER — Encounter (INDEPENDENT_AMBULATORY_CARE_PROVIDER_SITE_OTHER): Payer: Self-pay | Admitting: Orthopaedic Surgery

## 2017-04-26 DIAGNOSIS — Z96641 Presence of right artificial hip joint: Secondary | ICD-10-CM

## 2017-04-26 NOTE — Progress Notes (Signed)
The patient is now around 7 weeks status post a right total hip arthroplasty through direct anterior approach.  She is doing well overall.  She is back to her work.  She does have some aches and pains but range of motion and strength are increasing.  On exam I can easily put her right hip through range of motion without any difficulty.  Her ligaments are equal she does not walk with a significant limp.  She is not using assistive device to get around with either.  She says she does not any medications either.  We can see her back in 6 months unless she is having any issues.  At that visit I would like a low AP pelvis and lateral of her right operative hip.  All questions and concerns were answered and addressed.

## 2017-04-27 ENCOUNTER — Ambulatory Visit: Payer: BLUE CROSS/BLUE SHIELD | Admitting: Physical Therapy

## 2017-04-27 DIAGNOSIS — M6289 Other specified disorders of muscle: Secondary | ICD-10-CM

## 2017-04-27 DIAGNOSIS — M6281 Muscle weakness (generalized): Secondary | ICD-10-CM

## 2017-04-27 DIAGNOSIS — Z96641 Presence of right artificial hip joint: Secondary | ICD-10-CM

## 2017-04-27 DIAGNOSIS — M25551 Pain in right hip: Secondary | ICD-10-CM

## 2017-04-27 NOTE — Therapy (Signed)
[place], [place], [address] Phone: [phone]   Fax:  [phone]  Physical Therapy Treatment  Patient Details  Name: Stacy [name] MRN: [id number] Date of Birth: [birth year] Referring Provider: Ninfa Linden   Encounter Date: 04/27/2017  PT End of Session - 04/27/17 1020    Visit Number  7    Date for PT Re-Evaluation  06/01/17    PT Start Time  0930    PT Stop Time  1033    PT Time Calculation (min)  63 min       Past Medical History:  Diagnosis Date  . Arthritis    right hip  . Cancer [facility])    sigmoid colon cancer  . Family history of adverse reaction to anesthesia    father:confusion,disorientation  . GERD (gastroesophageal reflux disease)   . Hypertension     Past Surgical History:  Procedure Laterality Date  . adnoidectomy     as child  . LAPAROSCOPIC SIGMOID COLECTOMY N/A 11/09/2016   Procedure: LAPAROSCOPIC SIGMOID COLECTOMY;  Surgeon: Stark Klein, MD;  Location: [place];  Service: General;  Laterality: N/A;  . MULTIPLE TOOTH EXTRACTIONS     as a child  . MYRINGOTOMY     as a child  . PARTIAL COLECTOMY  11/09/2016   laparoscopic partial colectomy for sigmoid colon cancer  . SIGMOIDOSCOPY  03/01/2017   post-op eval   . TOOTH EXTRACTION    . TOTAL HIP ARTHROPLASTY Right 03/08/2017   Procedure: RIGHT TOTAL HIP ARTHROPLASTY ANTERIOR APPROACH;  Surgeon: Mcarthur Rossetti, MD;  [facility];  Service: Orthopedics;  Laterality: Right;    There were no vitals filed for this visit.  Subjective Assessment - 04/27/17 0938    Subjective  saw surgeon yesterday - pleased f/u in 6 monthes. started at gym yesterday and did well. no pain just stiffness    Currently in Pain?  No/denies         [facility] PT Assessment - 04/27/17 0001      Strength   Right Hip Flexion  4/5    Right Hip Extension  4+/5    Right Hip External Rotation   4/5    Right Hip Internal Rotation  4/5    Right Hip ABduction  4-/5    Right Hip ADduction  4+/5                  [facility] Adult PT Treatment/Exercise - 04/27/17 0001      Knee/Hip Exercises: Aerobic   Elliptical  2 fwd/2 back I 7 R 5    Tread Mill  SW 92mh 1.5 min each side      Knee/Hip Exercises: Machines for Strengthening   Cybex Leg Press  30# 2x15      Knee/Hip Exercises: Standing   Lateral Step Up  Both;15 reps;Hand Hold: 0;Step Height: 6" opp leg abd    Forward Step Up  Both;15 reps;Hand Hold: 0;Step Height: 6" opp leg ext    Other Standing Knee Exercises  hip 4 way BIL with cable 10 #    Other Standing Knee Exercises  4 inch up/over/rev on RT 15      Knee/Hip Exercises: Supine   Bridges with [facility] 15 reps    Bridges with Clamshell  Strengthening;Both;15 reps green tband    Straight Leg Raises  Strengthening;Both;2 sets;10 reps red tband with abd    Other Supine Knee/Hip Exercises  bridge with ball  15 times      Knee/Hip Exercises: Prone   Other Prone Exercises  plank 3 times 10 sec      Moist Heat Therapy   Number Minutes Moist Heat  15 Minutes    Moist Heat Location  Hip      Electrical Stimulation   Electrical Stimulation Location  RT hip/quad    Electrical Stimulation Action  IFC    Electrical Stimulation Parameters  supine    Electrical Stimulation Goals  Pain      Manual Therapy   Manual Therapy  Passive ROM    Manual therapy comments  ITB stripping    Passive ROM  RT hip( HS,quad,ITB and piriformis)             PT Education - 04/27/17 1020    Education provided  Yes    Education Details  reviewed gym ex, red tband hip ex and SLR for home and ITB rolling for home with spouse    Person(s) Educated  Patient    Methods  Explanation;Demonstration    Comprehension  Verbalized understanding;Returned demonstration       PT Short Term Goals - 04/20/17 1010      PT SHORT TERM GOAL #1   Title  independent with HEP    Status  Achieved        PT Long Term Goals - 04/27/17  1021      PT LONG TERM GOAL #1   Title  reports decrease in fear of transitional movements for QOL    Status  Partially Met      PT LONG TERM GOAL #2   Title  reports normal exercise routine with no pain or limitation for functional use and QOL    Baseline  started back to gym yesterday    Status  Partially Met      PT LONG TERM GOAL #3   Title  increase gross hip strength to 4+/5 for functional use    Status  Partially Met      PT LONG TERM GOAL #4   Title  increase hip IR/ER by 20 deg for functional use     Status  On-going      PT LONG TERM GOAL #5   Title  report no difficulty or pain donning/doffing socks for functional use    Status  On-going            Plan - 04/27/17 1022    Clinical Impression Statement  progressing with all goals, tolerated increased actviity well today0 min VCing needed. difficulty with SLS on RT to move left. educ on return to gym and ITB rolling at home. improved gait cadeance and minimal to no limp. ITB and quad tightness.    PT Treatment/Interventions  Medical illustrator education;Therapeutic activities;Therapeutic exercise;Moist Heat;Cryotherapy    PT Next Visit Plan  continue hip strengthening        Patient will benefit from skilled therapeutic intervention in order to improve the following deficits and impairments:  Abnormal gait, Difficulty walking, Decreased strength, Decreased range of motion, Impaired flexibility, Increased muscle spasms  Visit Diagnosis: Status post total replacement of right hip  Pain in right hip  Muscle weakness (generalized)  Muscle tightness     Problem List Patient Active Problem List   Diagnosis Date Noted  . Status post total replacement of right hip 03/08/2017  . Cancer of sigmoid colon ([place]) 11/09/2016  . Pain of right hip joint 09/19/2016  . Unilateral primary osteoarthritis, right hip  09/19/2016    Kenniel Bergsma,ANGIE PTA 04/27/2017, 10:24 AM  [place], [place], [address] Phone: [phone]   Fax:  [phone]  Name: Merril Isakson MRN: [id number] Date of Birth: [birth year]

## 2017-05-01 ENCOUNTER — Ambulatory Visit: Payer: BLUE CROSS/BLUE SHIELD | Admitting: Physical Therapy

## 2017-05-01 DIAGNOSIS — Z96641 Presence of right artificial hip joint: Secondary | ICD-10-CM | POA: Diagnosis not present

## 2017-05-01 DIAGNOSIS — M25551 Pain in right hip: Secondary | ICD-10-CM

## 2017-05-01 DIAGNOSIS — M6281 Muscle weakness (generalized): Secondary | ICD-10-CM

## 2017-05-01 DIAGNOSIS — M6289 Other specified disorders of muscle: Secondary | ICD-10-CM

## 2017-05-01 NOTE — Therapy (Signed)
[place], [place], [address] Phone: [phone]   Fax:  [phone]  Physical Therapy Treatment  Patient Details  Name: Stacy [name] MRN: [id number] Date of Birth: 1965-06-24 Referring Provider: Ninfa Linden   Encounter Date: 05/01/2017  PT End of Session - 05/01/17 2751    Visit Number  8    Date for PT Re-Evaluation  06/01/17    PT Start Time  7001    PT Stop Time  1605    PT Time Calculation (min)  80 min       Past Medical History:  Diagnosis Date  . Arthritis    right hip  . Cancer [facility])    sigmoid colon cancer  . Family history of adverse reaction to anesthesia    father:confusion,disorientation  . GERD (gastroesophageal reflux disease)   . Hypertension     Past Surgical History:  Procedure Laterality Date  . adnoidectomy     as child  . LAPAROSCOPIC SIGMOID COLECTOMY N/A 11/09/2016   Procedure: LAPAROSCOPIC SIGMOID COLECTOMY;  Surgeon: Stark Klein, MD;  Location: [place];  Service: General;  Laterality: N/A;  . MULTIPLE TOOTH EXTRACTIONS     as a child  . MYRINGOTOMY     as a child  . PARTIAL COLECTOMY  11/09/2016   laparoscopic partial colectomy for sigmoid colon cancer  . SIGMOIDOSCOPY  03/01/2017   post-op eval   . TOOTH EXTRACTION    . TOTAL HIP ARTHROPLASTY Right 03/08/2017   Procedure: RIGHT TOTAL HIP ARTHROPLASTY ANTERIOR APPROACH;  Surgeon: Mcarthur Rossetti, MD;  [facility];  Service: Orthopedics;  Laterality: Right;    There were no vitals filed for this visit.  Subjective Assessment - 05/01/17 1448    Subjective  doing well.no rolling at home yet but want to    Currently in Pain?  Yes    Pain Score  1     Pain Location  Hip    Pain Orientation  Right                      [facility] Adult PT Treatment/Exercise - 05/01/17 0001      Knee/Hip Exercises: Aerobic   Elliptical  3 fwd/3 back I 8 R 5    Tread Mill  push/pull 20 OFF     Nustep  L 6 5 min LE only      Knee/Hip Exercises: Machines for Strengthening   Cybex Knee Extension  10# 3 x 10    Cybex Knee Flexion  25# 3x10     Cybex Leg Press  40# 2x10. 20# RT only 2 sets 10      Knee/Hip Exercises: Plyometrics   Other Plyometric Exercises  3# running man 45 sec 2 times      Knee/Hip Exercises: Standing   Lateral Step Up  Both;15 reps;Hand Hold: 0;Step Height: 6"    Forward Step Up  Both;15 reps;Hand Hold: 0;Step Height: 6"    Forward Step Up Limitations  3# hip flex with knee ext 2 sets 10. SLR 3# 2 sets 10    Other Standing Knee Exercises  red tband side squat 20 feet 2 times each walking lunges 20 feet 4 times    Other Standing Knee Exercises  fitter flex,ext and abd      Moist Heat Therapy   Number Minutes Moist Heat  15 Minutes    Moist Heat Location  Hip  Acupuncturist Location  RT hip/quad    Electrical Stimulation Action  IFC    Electrical Stimulation Parameters  supine    Electrical Stimulation Goals  Pain      Manual Therapy   Manual therapy comments  ITB stripping               PT Short Term Goals - 04/20/17 1010      PT SHORT TERM GOAL #1   Title  independent with HEP    Status  Achieved        PT Long Term Goals - 04/27/17 1021      PT LONG TERM GOAL #1   Title  reports decrease in fear of transitional movements for QOL    Status  Partially Met      PT LONG TERM GOAL #2   Title  reports normal exercise routine with no pain or limitation for functional use and QOL    Baseline  started back to gym yesterday    Status  Partially Met      PT LONG TERM GOAL #3   Title  increase gross hip strength to 4+/5 for functional use    Status  Partially Met      PT LONG TERM GOAL #4   Title  increase hip IR/ER by 20 deg for functional use     Status  On-going      PT LONG TERM GOAL #5   Title  report no difficulty or pain donning/doffing socks for functional use    Status  On-going             Plan - 05/01/17 1554    Clinical Impression Statement  increased ther ex today without pain but still weakness esp with isolated hip flex ex. encouraged IT rolling at home    PT Treatment/Interventions  Electrical Stimulation;Patient/family education;Therapeutic activities;Therapeutic exercise;Moist Heat;Cryotherapy    PT Next Visit Plan  continue hip strengthening        Patient will benefit from skilled therapeutic intervention in order to improve the following deficits and impairments:  Abnormal gait, Difficulty walking, Decreased strength, Decreased range of motion, Impaired flexibility, Increased muscle spasms  Visit Diagnosis: Status post total replacement of right hip  Pain in right hip  Muscle weakness (generalized)  Muscle tightness     Problem List Patient Active Problem List   Diagnosis Date Noted  . Status post total replacement of right hip 03/08/2017  . Cancer of sigmoid colon ([place]) 11/09/2016  . Pain of right hip joint 09/19/2016  . Unilateral primary osteoarthritis, right hip 09/19/2016    PAYSEUR,ANGIE PTA 05/01/2017, 3:56 PM  [place], [place], [address] Phone: [phone]   Fax:  [phone]  Name: Stacy [name] MRN: [id number] Date of Birth: [birth year]

## 2017-05-04 ENCOUNTER — Ambulatory Visit: Payer: BLUE CROSS/BLUE SHIELD | Admitting: Physical Therapy

## 2017-05-04 ENCOUNTER — Encounter: Payer: Self-pay | Admitting: Physical Therapy

## 2017-05-04 DIAGNOSIS — M25551 Pain in right hip: Secondary | ICD-10-CM

## 2017-05-04 DIAGNOSIS — Z96641 Presence of right artificial hip joint: Secondary | ICD-10-CM | POA: Diagnosis not present

## 2017-05-04 DIAGNOSIS — M6281 Muscle weakness (generalized): Secondary | ICD-10-CM

## 2017-05-04 DIAGNOSIS — M6289 Other specified disorders of muscle: Secondary | ICD-10-CM

## 2017-05-04 NOTE — Therapy (Signed)
[name] [place], [place], [address] Phone: [phone]   Fax:  [phone]  Physical Therapy Treatment  Patient Details  Name: Stacy [name] MRN: [id number] Date of Birth: [birth year] Referring Provider: Ninfa Linden   Encounter Date: 05/04/2017  PT End of Session - 05/04/17 1016    Visit Number  9    Date for PT Re-Evaluation  06/01/17    PT Start Time  0930    PT Stop Time  1029    PT Time Calculation (min)  59 min    Activity Tolerance  Patient tolerated treatment well    Behavior During Therapy  [facility] for tasks assessed/performed       Past Medical History:  Diagnosis Date  . Arthritis    right hip  . Cancer [facility])    sigmoid colon cancer  . Family history of adverse reaction to anesthesia    father:confusion,disorientation  . GERD (gastroesophageal reflux disease)   . Hypertension     Past Surgical History:  Procedure Laterality Date  . adnoidectomy     as child  . LAPAROSCOPIC SIGMOID COLECTOMY N/A 11/09/2016   Procedure: LAPAROSCOPIC SIGMOID COLECTOMY;  Surgeon: Stark Klein, MD;  Location: [place];  Service: General;  Laterality: N/A;  . MULTIPLE TOOTH EXTRACTIONS     as a child  . MYRINGOTOMY     as a child  . PARTIAL COLECTOMY  11/09/2016   laparoscopic partial colectomy for sigmoid colon cancer  . SIGMOIDOSCOPY  03/01/2017   post-op eval   . TOOTH EXTRACTION    . TOTAL HIP ARTHROPLASTY Right 03/08/2017   Procedure: RIGHT TOTAL HIP ARTHROPLASTY ANTERIOR APPROACH;  Surgeon: Mcarthur Rossetti, MD;  [facility];  Service: Orthopedics;  Laterality: Right;    There were no vitals filed for this visit.  Subjective Assessment - 05/04/17 0931    Subjective  "Pretty good"    Currently in Pain?  No/denies    Pain Score  0-No pain                      [facility] Adult PT Treatment/Exercise - 05/04/17 0001      Ambulation/Gait   Gait Comments  3 flights of stairs  h[place]ing 3lb dumbbells      Knee/Hip Exercises: Aerobic   Elliptical  3 fwd/3 back I 10 R 5    Nustep  L 6 5 min LE only      Knee/Hip Exercises: Machines for Strengthening   Cybex Knee Extension  10lb 2x15    Cybex Knee Flexion  25# 2x15     Cybex Leg Press  40# 2x12. 20# RT only 2 sets 10      Knee/Hip Exercises: Standing   Walking with Sports Cord  40# 4 way x3 each    Other Standing Knee Exercises  SL dead lifts 3lb 2x8     Other Standing Knee Exercises  Forward & lateral step ups BOSU 2x10       Moist Heat Therapy   Number Minutes Moist Heat  15 Minutes    Moist Heat Location  Hip      Electrical Stimulation   Electrical Stimulation Location  RT hip/quad    Electrical Stimulation Action  IFC    Electrical Stimulation Parameters  supine    Electrical Stimulation Goals  Pain               PT Short Term Goals - 04/20/17  [place] #1   Title  independent with HEP    Status  Achieved        PT Long Term Goals - 04/27/17 1021      PT LONG TERM GOAL #1   Title  reports decrease in fear of transitional movements for QOL    Status  Partially Met      PT LONG TERM GOAL #2   Title  reports normal exercise routine with no pain or limitation for functional use and QOL    Baseline  started back to gym yesterday    Status  Partially Met      PT LONG TERM GOAL #3   Title  increase gross hip strength to 4+/5 for functional use    Status  Partially Met      PT LONG TERM GOAL #4   Title  increase hip IR/ER by 20 deg for functional use     Status  On-going      PT LONG TERM GOAL #5   Title  report no difficulty or pain donning/doffing socks for functional use    Status  On-going            Plan - 05/04/17 1017    Clinical Impression Statement  Pt with some fatigue negotiating stairs, instability with BOSU step ups. R hip weakness and shaking with SL dead lifts. Dead lifts were modified limiting pt ROM. No reports of pain with today's  activities.    Rehab Potential  Good    PT Frequency  2x / week    PT Treatment/Interventions  Electrical Stimulation;Patient/family education;Therapeutic activities;Therapeutic exercise;Moist Heat;Cryotherapy    PT Next Visit Plan  continue hip strengthening        Patient will benefit from skilled therapeutic intervention in order to improve the following deficits and impairments:  Abnormal gait, Difficulty walking, Decreased strength, Decreased range of motion, Impaired flexibility, Increased muscle spasms  Visit Diagnosis: Status post total replacement of right hip  Pain in right hip  Muscle weakness (generalized)  Muscle tightness     Problem List Patient Active Problem List   Diagnosis Date Noted  . Status post total replacement of right hip 03/08/2017  . Cancer of sigmoid colon ([place]) 11/09/2016  . Pain of right hip joint 09/19/2016  . Unilateral primary osteoarthritis, right hip 09/19/2016    Scot Jun, PTA 05/04/2017, 10:20 AM  [place], [place], [address] Phone: [phone]   Fax:  [phone]  Name: Stacy [name] MRN: [id number] Date of Birth: September 25, 1965

## 2017-05-08 ENCOUNTER — Encounter: Payer: Self-pay | Admitting: Physical Therapy

## 2017-05-08 ENCOUNTER — Ambulatory Visit: Payer: BLUE CROSS/BLUE SHIELD | Admitting: Physical Therapy

## 2017-05-08 DIAGNOSIS — M6281 Muscle weakness (generalized): Secondary | ICD-10-CM

## 2017-05-08 DIAGNOSIS — M6289 Other specified disorders of muscle: Secondary | ICD-10-CM

## 2017-05-08 DIAGNOSIS — Z96641 Presence of right artificial hip joint: Secondary | ICD-10-CM

## 2017-05-08 DIAGNOSIS — M25551 Pain in right hip: Secondary | ICD-10-CM

## 2017-05-08 NOTE — Therapy (Signed)
During this treatment session, the therapist was present, participating in and directing the treatment. [place], [place], [address] Phone: [phone]   Fax:  [phone]  Physical Therapy Treatment  Patient Details  Name: Stacy [name] MRN: [id number] Date of Birth: 10-Aug-1965 Referring Provider: Ninfa Linden   Encounter Date: 05/08/2017  PT End of Session - 05/08/17 3491    Visit Number  10    Date for PT Re-Evaluation  06/01/17    PT Start Time  1515    PT Stop Time  1600    PT Time Calculation (min)  45 min    Activity Tolerance  Patient tolerated treatment well    Behavior During Therapy  [facility] for tasks assessed/performed       Past Medical History:  Diagnosis Date  . Arthritis    right hip  . Cancer [facility])    sigmoid colon cancer  . Family history of adverse reaction to anesthesia    father:confusion,disorientation  . GERD (gastroesophageal reflux disease)   . Hypertension     Past Surgical History:  Procedure Laterality Date  . adnoidectomy     as child  . LAPAROSCOPIC SIGMOID COLECTOMY N/A 11/09/2016   Procedure: LAPAROSCOPIC SIGMOID COLECTOMY;  Surgeon: Stark Klein, MD;  Location: [place];  Service: General;  Laterality: N/A;  . MULTIPLE TOOTH EXTRACTIONS     as a child  . MYRINGOTOMY     as a child  . PARTIAL COLECTOMY  11/09/2016   laparoscopic partial colectomy for sigmoid colon cancer  . SIGMOIDOSCOPY  03/01/2017   post-op eval   . TOOTH EXTRACTION    . TOTAL HIP ARTHROPLASTY Right 03/08/2017   Procedure: RIGHT TOTAL HIP ARTHROPLASTY ANTERIOR APPROACH;  Surgeon: Mcarthur Rossetti, MD;  [facility];  Service: Orthopedics;  Laterality: Right;    There were no vitals filed for this visit.  Subjective Assessment - 05/08/17 1517    Subjective  Pt. reports feeling good, just usual discomfort. Pt. felt good after last tx.     Currently in Pain?  Yes    Pain  Score  1  "usual discomfort"                      [facility] Adult PT Treatment/Exercise - 05/08/17 0001      Ambulation/Gait   Gait Comments  3 flights of stairs holding 3# dumbbells, resisted light jogging and backwards walking with black tband x3      Knee/Hip Exercises: Aerobic   Elliptical  3 fwd/3 back I 10 R 5    Tread Mill  push/pull 20 OFF    Nustep  L6x5 min LE only      Knee/Hip Exercises: Machines for Strengthening   Cybex Knee Extension  --    Cybex Knee Flexion  --    Cybex Leg Press  40# 2x12, RLE 20# 2x12      Knee/Hip Exercises: Standing   Walking with Sports Cord  40# side stepping x5     Other Standing Knee Exercises  Forward & lateral step ups BOSU 2x15               PT Short Term Goals - 04/20/17 1010      PT SHORT TERM GOAL #1   Title  independent with HEP    Status  Achieved        PT Long Term Goals - 04/27/17 1021  PT LONG TERM GOAL #1   Title  reports decrease in fear of transitional movements for QOL    Status  Partially Met      PT LONG TERM GOAL #2   Title  reports normal exercise routine with no pain or limitation for functional use and QOL    Baseline  started back to gym yesterday    Status  Partially Met      PT LONG TERM GOAL #3   Title  increase gross hip strength to 4+/5 for functional use    Status  Partially Met      PT LONG TERM GOAL #4   Title  increase hip IR/ER by 20 deg for functional use     Status  On-going      PT LONG TERM GOAL #5   Title  report no difficulty or pain donning/doffing socks for functional use    Status  On-going            Plan - 05/08/17 1549    Clinical Impression Statement  Pt. tolerated tx well. Pt. demonstrated RLE weakness and instability with bosu ball forward and lateral step up. Pt. experienced lots of fatigue with stairs and resisted gait. Pt. needed multiple rests with resisted backward walking (potentially do to incline outside) during the last set due to  fatigue. Overall, pt. demonstrates good strength. No reports of pain with activity. Pt, is starting to incorporate therex from PT at gym.    Rehab Potential  Good    PT Frequency  2x / week    PT Duration  8 weeks    PT Treatment/Interventions  Electrical Stimulation;Patient/family education;Therapeutic activities;Therapeutic exercise;Moist Heat;Cryotherapy    PT Next Visit Plan  continue hip strengthening     Consulted and Agree with Plan of Care  Patient       Patient will benefit from skilled therapeutic intervention in order to improve the following deficits and impairments:  Abnormal gait, Difficulty walking, Decreased strength, Decreased range of motion, Impaired flexibility, Increased muscle spasms  Visit Diagnosis: Status post total replacement of right hip  Pain in right hip  Muscle weakness (generalized)  Muscle tightness     Problem List Patient Active Problem List   Diagnosis Date Noted  . Status post total replacement of right hip 03/08/2017  . Cancer of sigmoid colon ([place]) 11/09/2016  . Pain of right hip joint 09/19/2016  . Unilateral primary osteoarthritis, right hip 09/19/2016    Juliann Pulse SPT 05/08/2017, 3:57 PM  [place], [place], [address] Phone: [phone]   Fax:  [phone]  Name: Stacy [name] MRN: [id number] Date of Birth: Jul 12, 1965

## 2017-05-10 ENCOUNTER — Encounter: Payer: Self-pay | Admitting: Physical Therapy

## 2017-05-10 ENCOUNTER — Ambulatory Visit: Payer: BLUE CROSS/BLUE SHIELD | Admitting: Physical Therapy

## 2017-05-10 DIAGNOSIS — Z96641 Presence of right artificial hip joint: Secondary | ICD-10-CM | POA: Diagnosis not present

## 2017-05-10 DIAGNOSIS — M25551 Pain in right hip: Secondary | ICD-10-CM

## 2017-05-10 DIAGNOSIS — M6289 Other specified disorders of muscle: Secondary | ICD-10-CM

## 2017-05-10 DIAGNOSIS — M6281 Muscle weakness (generalized): Secondary | ICD-10-CM

## 2017-05-10 NOTE — Therapy (Signed)
During this treatment session, the therapist was present, participating in and directing the treatment. [place] [address] [place], [place], [address] Phone: [phone]   Fax:  [phone]  Physical Therapy Treatment  Patient Details  Name: Stacy [name] MRN: [id number] Date of Birth: 08-19-1965 Referring Provider: Ninfa Linden   Encounter Date: 05/10/2017  PT End of Session - 05/10/17 1522    Visit Number  11    Date for PT Re-Evaluation  06/01/17    PT Start Time  1515    PT Stop Time  1600    PT Time Calculation (min)  45 min    Activity Tolerance  Patient tolerated treatment well    Behavior During Therapy  [facility] for tasks assessed/performed       Past Medical History:  Diagnosis Date  . Arthritis    right hip  . Cancer [facility])    sigmoid colon cancer  . Family history of adverse reaction to anesthesia    father:confusion,disorientation  . GERD (gastroesophageal reflux disease)   . Hypertension     Past Surgical History:  Procedure Laterality Date  . adnoidectomy     as child  . LAPAROSCOPIC SIGMOID COLECTOMY N/A 11/09/2016   Procedure: LAPAROSCOPIC SIGMOID COLECTOMY;  Surgeon: Stark Klein, MD;  Location: [place];  Service: General;  Laterality: N/A;  . MULTIPLE TOOTH EXTRACTIONS     as a child  . MYRINGOTOMY     as a child  . PARTIAL COLECTOMY  11/09/2016   laparoscopic partial colectomy for sigmoid colon cancer  . SIGMOIDOSCOPY  03/01/2017   post-op eval   . TOOTH EXTRACTION    . TOTAL HIP ARTHROPLASTY Right 03/08/2017   Procedure: RIGHT TOTAL HIP ARTHROPLASTY ANTERIOR APPROACH;  Surgeon: Mcarthur Rossetti, MD;  Location[facility];  Service: Orthopedics;  Laterality: Right;    There were no vitals filed for this visit.  Subjective Assessment - 05/10/17 1519    Subjective  Pt. reports slipping last night on a step and banged her R knee. She's been compensating for the knee pain which has  caused her right hip to be sore.    Currently in Pain?  Yes    Pain Score  2                       [facility] Adult PT Treatment/Exercise - 05/10/17 0001      Knee/Hip Exercises: Aerobic   Elliptical  3 fwd/3 back I 10 R 5    Tread Mill  push/pull 20 OFF    Nustep  L5x6 mins LE only      Knee/Hip Exercises: Machines for Strengthening   Cybex Knee Extension  10# 2x15    Cybex Knee Flexion  25# 2x15     Cybex Leg Press  40# 2x12, RLE 20# 2x12      Knee/Hip Exercises: Standing   Other Standing Knee Exercises  3 flight stairs light jog, resisted gait foward and backward down hallway x3 black tband, resisted gait side step with black t band 180f x3, walking lunges 100 ft x2 weakness with eccentric loading w lunges and fatigue    Other Standing Knee Exercises  Forward & lateral step ups BOSU 2x15               PT Short Term Goals - 04/20/17 1010      PT SHORT TERM GOAL #1   Title  independent with HEP  Status  Achieved        PT Long Term Goals - 05/10/17 1521      PT LONG TERM GOAL #1   Title  reports decrease in fear of transitional movements for QOL    Status  Partially Met      PT LONG TERM GOAL #2   Title  reports normal exercise routine with no pain or limitation for functional use and QOL    Baseline  started back to gym yesterday    Status  Partially Met      PT LONG TERM GOAL #3   Title  increase gross hip strength to 4+/5 for functional use    Status  Partially Met      PT LONG TERM GOAL #4   Title  increase hip IR/ER by 20 deg for functional use     Status  On-going      PT LONG TERM GOAL #5   Title  report no difficulty or pain donning/doffing socks for functional use    Status  Achieved            Plan - 05/10/17 1552    Clinical Impression Statement  Pt. had a 'slip' last night and hurt knee, due to compensatory movements, hip was a little sore today. Pt. tolerated tx well despite the soreness. Pt. fatigued easily with stair  negotiation, resisted gait, and walking lunges. Pt. demonstrates weakness with eccentric load during walking lunges and fatigued very quickly. Talked about going down to 1x/week.     Rehab Potential  Good    PT Frequency  2x / week    PT Duration  8 weeks    PT Treatment/Interventions  Electrical Stimulation;Patient/family education;Therapeutic activities;Therapeutic exercise;Moist Heat;Cryotherapy    PT Next Visit Plan  continue hip strengthening     Consulted and Agree with Plan of Care  Patient       Patient will benefit from skilled therapeutic intervention in order to improve the following deficits and impairments:  Abnormal gait, Difficulty walking, Decreased strength, Decreased range of motion, Impaired flexibility, Increased muscle spasms  Visit Diagnosis: Status post total replacement of right hip  Pain in right hip  Muscle weakness (generalized)  Muscle tightness     Problem List Patient Active Problem List   Diagnosis Date Noted  . Status post total replacement of right hip 03/08/2017  . Cancer of sigmoid colon ([place]) 11/09/2016  . Pain of right hip joint 09/19/2016  . Unilateral primary osteoarthritis, right hip 09/19/2016    Juliann Pulse SPT 05/10/2017, 3:57 PM  [place], [place], [address] Phone: [phone]   Fax:  [phone]  Name: Dynastie Knoop MRN: [id number] Date of Birth: [birth year]

## 2017-09-17 ENCOUNTER — Ambulatory Visit
Admission: RE | Admit: 2017-09-17 | Discharge: 2017-09-17 | Disposition: A | Discharge status: Home / Self Care | Payer: BLUE CROSS/BLUE SHIELD | Source: Ambulatory Visit | Attending: Internal Medicine | Admitting: Internal Medicine

## 2017-09-17 ENCOUNTER — Other Ambulatory Visit: Payer: Self-pay | Admitting: Internal Medicine

## 2017-09-17 DIAGNOSIS — N6001 Solitary cyst of right breast: Secondary | ICD-10-CM

## 2017-09-17 DIAGNOSIS — Z1231 Encounter for screening mammogram for malignant neoplasm of breast: Secondary | ICD-10-CM

## 2017-10-01 ENCOUNTER — Other Ambulatory Visit: Payer: Self-pay | Admitting: Internal Medicine

## 2017-10-01 DIAGNOSIS — N6009 Solitary cyst of unspecified breast: Secondary | ICD-10-CM

## 2017-10-24 ENCOUNTER — Ambulatory Visit (INDEPENDENT_AMBULATORY_CARE_PROVIDER_SITE_OTHER): Payer: BLUE CROSS/BLUE SHIELD | Admitting: Orthopaedic Surgery

## 2017-10-24 ENCOUNTER — Ambulatory Visit (INDEPENDENT_AMBULATORY_CARE_PROVIDER_SITE_OTHER): Payer: Self-pay

## 2017-10-24 ENCOUNTER — Encounter (INDEPENDENT_AMBULATORY_CARE_PROVIDER_SITE_OTHER): Payer: Self-pay | Admitting: Orthopaedic Surgery

## 2017-10-24 DIAGNOSIS — Z96641 Presence of right artificial hip joint: Secondary | ICD-10-CM

## 2017-10-24 NOTE — Progress Notes (Signed)
The patient is now 6 months status post a right total hip arthroplasty.  She is doing well overall and has no issues other than pain at her "tailbone".  This is been going on for about 2 months now.  As far as her right hip goes, she says she is doing well.  She is sleeping better and has no issues with the hip.  On exam I can move both right and left hips around easily with no issues and pain at all.  Her leg lengths are equal.  An AP pelvis and lateral of the right hip shows a well-seated implant with bone ingrowth no complicating features.  On the AP view of her pelvis there was no significant findings of her sacrum that I can see.  From my standpoint she will follow-up as needed.  I have recommended she take anti-inflammatories on a daily basis or at least twice daily for the next week to 2 weeks to see how this does from her tailbone standpoint.  All question concerns were answered and addressed.  If she has any issues she will let us know.

## 2017-11-21 ENCOUNTER — Ambulatory Visit: Payer: BLUE CROSS/BLUE SHIELD | Admitting: Physical Therapy

## 2017-11-29 ENCOUNTER — Encounter: Payer: Self-pay | Admitting: Physical Therapy

## 2017-11-29 ENCOUNTER — Other Ambulatory Visit: Payer: Self-pay

## 2017-11-29 ENCOUNTER — Ambulatory Visit: Payer: BLUE CROSS/BLUE SHIELD | Attending: General Surgery | Admitting: Physical Therapy

## 2017-11-29 DIAGNOSIS — M533 Sacrococcygeal disorders, not elsewhere classified: Secondary | ICD-10-CM | POA: Diagnosis present

## 2017-11-29 DIAGNOSIS — R278 Other lack of coordination: Secondary | ICD-10-CM | POA: Insufficient documentation

## 2017-11-29 DIAGNOSIS — M6281 Muscle weakness (generalized): Secondary | ICD-10-CM | POA: Diagnosis not present

## 2017-11-29 NOTE — Patient Instructions (Addendum)
About Abdominal Massage  Abdominal massage, also called external colon massage, is a self-treatment circular massage technique that can reduce and eliminate gas and ease constipation. The colon naturally contracts in waves in a clockwise direction starting from inside the right hip, moving up toward the ribs, across the belly, and down inside the left hip.  When you perform circular abdominal massage, you help stimulate your colon's normal wave pattern of movement called peristalsis.  It is most beneficial when done after eating.  Positioning You can practice abdominal massage with oil while lying down, or in the shower with soap.  Some people find that it is just as effective to do the massage through clothing while sitting or standing.  How to Massage Start by placing your finger tips or knuckles on your right side, just inside your hip bone.  . Make small circular movements while you move upward toward your rib cage.   . Once you reach the bottom right side of your rib cage, take your circular movements across to the left side of the bottom of your rib cage.  . Next, move downward until you reach the inside of your left hip bone.  This is the path your feces travel in your colon. . Continue to perform your abdominal massage in this pattern for 10 minutes each day.     You can apply as much pressure as is comfortable in your massage.  Start gently and build pressure as you continue to practice.  Notice any areas of pain as you massage; areas of slight pain may be relieved as you massage, but if you have areas of significant or intense pain, consult with your healthcare provider.  Other Considerations . General physical activity including bending and stretching can have a beneficial massage-like effect on the colon.  Deep breathing can also stimulate the colon because breathing deeply activates the same nervous system that supplies the colon.   . Abdominal massage should always be used in  combination with a bowel-conscious diet that is high in the proper type of fiber for you, fluids (primarily water), and a regular exercise program. Isometric Hold (Hook-Lying)    Lie with hips and knees bent. Slowly inhale, and then exhale. Pull navel toward spine and Hold for _5__ seconds. Continue to breathe in and out during hold. Rest for _5__ seconds. Repeat __10_ times. Do _2__ times a day.   Copyright  VHI. All rights reserved.  [place] [address] [place], [place] [address] Phone # [phone] Fax [phone]

## 2017-11-29 NOTE — Therapy (Signed)
[facility] [address], [place], [place], [address] Phone: [phone]   Fax:  [phone]  Physical Therapy Evaluation  Patient Details  Name: Stacy [name] MRN: [id number] Date of Birth: 09-Aug-1965 Referring Provider: Dr. Stark Klein   Encounter Date: 11/29/2017  PT End of Session - 11/29/17 0933    Visit Number  1    Date for PT Re-Evaluation  02/21/18    Authorization Type  [employer]    PT Start Time  0935    PT Stop Time  1010    PT Time Calculation (min)  35 min    Activity Tolerance  Patient tolerated treatment well    Behavior During Therapy  [facility] for tasks assessed/performed       Past Medical History:  Diagnosis Date  . Arthritis    right hip  . Cancer [facility])    sigmoid colon cancer  . Family history of adverse reaction to anesthesia    father:confusion,disorientation  . GERD (gastroesophageal reflux disease)   . Hypertension     Past Surgical History:  Procedure Laterality Date  . adnoidectomy     as child  . LAPAROSCOPIC SIGMOID COLECTOMY N/A 11/09/2016   Procedure: LAPAROSCOPIC SIGMOID COLECTOMY;  Surgeon: Stark Klein, MD;  Location: [place];  Service: General;  Laterality: N/A;  . MULTIPLE TOOTH EXTRACTIONS     as a child  . MYRINGOTOMY     as a child  . PARTIAL COLECTOMY  11/09/2016   laparoscopic partial colectomy for sigmoid colon cancer  . SIGMOIDOSCOPY  03/01/2017   post-op eval   . TOOTH EXTRACTION    . TOTAL HIP ARTHROPLASTY Right 03/08/2017   Procedure: RIGHT TOTAL HIP ARTHROPLASTY ANTERIOR APPROACH;  Surgeon: Mcarthur Rossetti, MD;  [facility];  Service: Orthopedics;  Laterality: Right;    There were no vitals filed for this visit.   Subjective Assessment - 11/29/17 0939    Subjective  Patient reports issues with urinary leakage for several years. Since the 2 surgeries the urinary incontinence has become worse and especially since 04/13/2017. Tailbone pain  started 3 months ago. Past 2-3 weeks has a feeling of constipation. Patient feels she has to poop and unable to .     Patient Stated Goals  reduce urinary leakage    Currently in Pain?  Yes    Pain Score  6     Pain Location  Coccyx    Pain Orientation  Mid    Pain Descriptors / Indicators  Aching    Pain Type  Acute pain    Pain Onset  More than a month ago    Pain Frequency  Intermittent    Aggravating Factors   sit to stand activity, sitting to play an instrument on hard chair, sitting on couch    Pain Relieving Factors  change position; sit on a different hip         [facility] PT Assessment - 11/29/17 0001      Assessment   Medical Diagnosis  N39.3 Stress Incontinence    Referring Provider  Dr. Stark Klein    Onset Date/Surgical Date  04/13/17    Prior Therapy  yes for the total hip      Precautions   Precautions  Other (comment)    Precaution Comments  cancer      Restrictions   Weight Bearing Restrictions  No      Balance Screen   Has the patient fallen in the  past 6 months  No    Has the patient had a decrease in activity level because of a fear of falling?   No    Is the patient reluctant to leave their home because of a fear of falling?   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    Leisure  would like to get back into exercise; play recorder      Cognition   Overall Cognitive Status  Within Functional Limits for tasks assessed      Posture/Postural Control   Posture/Postural Control  Postural limitations    Postural Limitations  Rounded Shoulders;Forward head;Flexed trunk    Posture Comments  has scoliosis in the thoracic area      ROM / Strength   AROM / PROM / Strength  AROM;PROM;Strength      Strength   Overall Strength Comments  abdominal strength 2/5; trouble  isolating the lower abdominals from the upper; will overcontract the upper    Right Hip Extension  4+/5    Right Hip ABduction  4-/5    Left Hip Extension  4/5    Left Hip ABduction  4/5      Palpation   Palpation comment  tightness and tenderness located in the abdominals      Transfers   Transfers  Not assessed      Ambulation/Gait   Ambulation/Gait  No                Objective measurements completed on examination: See above findings.    Pelvic Floor Special Questions - 11/29/17 0001    Prior Pregnancies  No    Currently Sexually Active  No    Urinary Leakage  Yes    Activities that cause leaking  Sneezing;Coughing;Other;Lifting    Other activities that cause leaking  fast walk,     Urinary frequency  sometimes has the sensation of the bladder not emptying fully    Fecal incontinence  No    Falling out feeling (prolapse)  Yes    External Palpation  tenderness located on bilateral bulbocavernosus    Pelvic Floor Internal Exam  Patient confirms identification and approves PT to aseess muscles and treatment    Exam Type  Vaginal    Palpation  decreased mobility of left urethra sphincter    Strength  weak squeeze, no lift   stronger on left than right              PT Education - 11/29/17 1028    Education provided  Yes    Education Details  abdominal massage; lower abdominal contraction    Person(s) Educated  Patient    Methods  Explanation;Demonstration;Verbal cues;Handout    Comprehension  Verbalized understanding;Returned demonstration       PT Short Term Goals - 11/29/17 1117      PT SHORT TERM GOAL #1   Title  independent with initial HEP    Time  4    Period  Weeks    Status  New    Target Date  12/27/17      PT SHORT TERM GOAL #2   Title  understand abdominal massage to improve bowel movements and abdominal tissue mobility    Time  4  Period  Weeks    Status  New    Target Date  12/27/17      PT SHORT TERM GOAL #3   Title   understand correct toileting technique for urination and bowel movements to fully empty bladder and rectum    Time  4    Period  Weeks    Status  New    Target Date  12/27/17        PT Long Term Goals - 11/29/17 1119      PT LONG TERM GOAL #1   Title  independent with HEP and understand how to progress herself and importance of contiuation    Time  12    Period  Weeks    Status  New    Target Date  02/21/18      PT LONG TERM GOAL #2   Title  urinary leakage with coughing, sneezing, and lifting decreased >/= 80% due to improved pelvic floor strength  >/= 4/5    Baseline  ---    Time  12    Period  Weeks    Status  New    Target Date  02/21/18      PT LONG TERM GOAL #3   Title  urinary leakage with fast walk decreased >/= 80% due to correct breathing pattern and increased pelvic floor strength     Time  12    Period  Weeks    Status  New    Target Date  02/21/18      PT LONG TERM GOAL #4   Title  sit with coccyx pain decreased >/= 75% due to improved elongation of muscles    Time  12    Period  Weeks    Status  New    Target Date  02/21/18      PT LONG TERM GOAL #5   Title  ability to fully empty her bladder and bowels due to ability to fully relax the pelvic floor muscles and contract the lower abdominals    Time  12    Period  Weeks    Status  New    Target Date  02/21/18             Plan - 11/29/17 1028    Clinical Impression Statement  Patient is a 52 year old female with stress incontinence and coccyx pain.  Patient reports her intermittent coccyx pain is at level 7/10 with sitting on different surfaces. Patient will decrease pain by changing her position in sitting.  Patient will over contract her upper abdominals and no contraction of her lower abdominals.  Tenderness throughout the abdominal region and decreased mobility of the diaphgram.  Palpable tenderness located on bilateral buoblcavernosus.  Pelvic floor strength is 2/5 with less of contraction on  the right.  Patient reports she has difficulty with fully emptying her bladder and sometimes she feels like she is to have a bowel movment and unable to go.  Patient reports urinary leakage with coughing, sneezing, lifting and fast walk.  Patient will benefit from skilled therapy to improve pelvic floor strength and coordination to reduce urinary leakage and improve bowel function.     History and Personal Factors relevant to plan of care:  right anterior THR 03/08/2017; Sigmoid cancer 10/2016; LAPAROSCOPIC SIGMOID COLECTOMY 10/26/2017    Clinical Presentation  Stable    Clinical Presentation due to:  trouble emptying bladder, coccyx pain with sitting, urine leakage with activity    Rehab Potential  Excellent    Clinical Impairments Affecting Rehab Potential  right anterior THR 03/08/2017; Sigmoid cancer 10/2016    PT Frequency  1x / week    PT Duration  12 weeks    PT Treatment/Interventions  Biofeedback;Electrical Stimulation;Cryotherapy;Moist Heat;Ultrasound;Therapeutic activities;Therapeutic exercise;Manual techniques;Dry needling;Patient/family education;Neuromuscular re-education    PT Next Visit Plan  soft tissue work to pelvic floor and abdominals, work on pelvic floor contraction with tactile cues, hip stretches; toileting technique    Consulted and Agree with Plan of Care  Patient       Patient will benefit from skilled therapeutic intervention in order to improve the following deficits and impairments:  Pain, Increased muscle spasms, Impaired flexibility, Decreased activity tolerance, Decreased endurance, Decreased strength, Decreased coordination  Visit Diagnosis: Muscle weakness (generalized) - Plan: PT plan of care cert/re-cert  Other lack of coordination - Plan: PT plan of care cert/re-cert  Coccyx pain - Plan: PT plan of care cert/re-cert     Problem List Patient Active Problem List   Diagnosis Date Noted  . Status post total replacement of right hip 03/08/2017  . Cancer of  sigmoid colon ([place]) 11/09/2016  . Pain of right hip joint 09/19/2016  . Unilateral primary osteoarthritis, right hip 09/19/2016    Earlie Counts, PT 11/29/17 11:30 AM   [facility] [address], [place], [place], [address] Phone: [phone]   Fax:  [phone]  Name: Brenlyn Beshara MRN: [id number] Date of Birth: 03/10/1966

## 2017-12-07 ENCOUNTER — Encounter: Payer: BLUE CROSS/BLUE SHIELD | Admitting: Physical Therapy

## 2017-12-12 ENCOUNTER — Encounter: Payer: Self-pay | Admitting: Physical Therapy

## 2017-12-12 ENCOUNTER — Ambulatory Visit: Payer: BLUE CROSS/BLUE SHIELD | Attending: General Surgery | Admitting: Physical Therapy

## 2017-12-12 DIAGNOSIS — R278 Other lack of coordination: Secondary | ICD-10-CM

## 2017-12-12 DIAGNOSIS — M6281 Muscle weakness (generalized): Secondary | ICD-10-CM | POA: Insufficient documentation

## 2017-12-12 DIAGNOSIS — M533 Sacrococcygeal disorders, not elsewhere classified: Secondary | ICD-10-CM | POA: Diagnosis present

## 2017-12-12 NOTE — Patient Instructions (Signed)
Access Code: [email or web address]  URL: [email or web address]  Date: 12/12/2017  Prepared by: Earlie Counts   Exercises  Seated Hamstring Stretch - 2 reps - 1 sets - 30 sec hold - 1x daily - 7x weekly  Seated Piriformis Stretch with Trunk Bend - 2 reps - 1 sets - 30 sec hold - 1x daily - 7x weekly  Supine Lower Trunk Rotation - 2 reps - 1 sets - 30 sec hold - 1x daily - 7x weekly  Supine Butterfly Groin Stretch - 1 reps - 1 sets - 1 min hold - 1x daily - 7x weekly  Cat Cow - 10 reps - 1 sets - 1x daily - 7x weekly  Supine Pelvic Tilt - 10 reps - 1 sets - 1x daily - 7x weekly  [facility] [address], [place], [place] [address] Phone # [phone] Fax [phone]

## 2017-12-12 NOTE — Therapy (Signed)
[facility] [address] [place], [place], [address] Phone: [phone]   Fax:  [phone]  Physical Therapy Treatment  Patient Details  Name: Stacy [name] MRN: [id number] Date of Birth: 19-Oct-1965 Referring Provider (PT): Dr. Stark Klein   Encounter Date: 12/12/2017  PT End of Session - 12/12/17 1141    Visit Number  2    Date for PT Re-Evaluation  02/21/18    Authorization Type  BCBS    PT Start Time  1100    PT Stop Time  1140    PT Time Calculation (min)  40 min    Activity Tolerance  Patient tolerated treatment well    Behavior During Therapy  [facility] for tasks assessed/performed       Past Medical History:  Diagnosis Date  . Arthritis    right hip  . Cancer [facility])    sigmoid colon cancer  . Family history of adverse reaction to anesthesia    father:confusion,disorientation  . GERD (gastroesophageal reflux disease)   . Hypertension     Past Surgical History:  Procedure Laterality Date  . adnoidectomy     as child  . LAPAROSCOPIC SIGMOID COLECTOMY N/A 11/09/2016   Procedure: LAPAROSCOPIC SIGMOID COLECTOMY;  Surgeon: Stark Klein, MD;  Location: [place];  Service: General;  Laterality: N/A;  . MULTIPLE TOOTH EXTRACTIONS     as a child  . MYRINGOTOMY     as a child  . PARTIAL COLECTOMY  11/09/2016   laparoscopic partial colectomy for sigmoid colon cancer  . SIGMOIDOSCOPY  03/01/2017   post-op eval   . TOOTH EXTRACTION    . TOTAL HIP ARTHROPLASTY Right 03/08/2017   Procedure: RIGHT TOTAL HIP ARTHROPLASTY ANTERIOR APPROACH;  Surgeon: Mcarthur Rossetti, MD;  [facility];  Service: Orthopedics;  Laterality: Right;    There were no vitals filed for this visit.  Subjective Assessment - 12/12/17 1105    Subjective  I forgot about the abdominal massage. After I saw you I got locked up with constipation then had a cold.  I am feeling better with my cold. The coccyx pain is better. I have  not had as much coccyx pain in the past week.     Patient Stated Goals  reduce urinary leakage    Currently in Pain?  Yes    Pain Score  1     Pain Location  Coccyx    Pain Orientation  Mid    Pain Descriptors / Indicators  Aching    Pain Type  Acute pain    Pain Onset  More than a month ago    Pain Frequency  Intermittent    Aggravating Factors   sit to stand activity, sitting to play an instrument on hard chair, sitting on couch    Pain Relieving Factors  change positions; sit on a different hip    Multiple Pain Sites  No                       [facility] Adult PT Treatment/Exercise - 12/12/17 0001      Exercises   Exercises  Lumbar      Lumbar Exercises: Stretches   Active Hamstring Stretch  Right;Left;1 rep;30 seconds   sitting   Lower Trunk Rotation  2 reps;30 seconds    Lower Trunk Rotation Limitations  supine    Pelvic Tilt  10 reps;5 seconds    Piriformis Stretch  Right;Left;1 rep;30 seconds  sitting   Other Lumbar Stretch Exercise  butterfly stertch      Manual Therapy   Manual Therapy  Soft tissue mobilization    Soft tissue mobilization  scar mobilization with pulling up the tissue; circular massage to improve peristalic movement of the colon; circular massage around the umbilicus, pull up from back  to release the fascia, lower abdominal tissue work ; diaphgram release             PT Education - 12/12/17 1140    Education provided  Yes    Education Details  Access Code: [email or web address]     Person(s) Educated  Patient    Methods  Explanation;Demonstration;Handout    Comprehension  Verbalized understanding;Returned demonstration       PT Short Term Goals - 11/29/17 1117      PT SHORT TERM GOAL #1   Title  independent with initial HEP    Time  4    Period  Weeks    Status  New    Target Date  12/27/17      PT SHORT TERM GOAL #2   Title  understand abdominal massage to improve bowel movements and abdominal tissue mobility    Time  4    Period   Weeks    Status  New    Target Date  12/27/17      PT SHORT TERM GOAL #3   Title  understand correct toileting technique for urination and bowel movements to fully empty bladder and rectum    Time  4    Period  Weeks    Status  New    Target Date  12/27/17        PT Long Term Goals - 11/29/17 1119      PT LONG TERM GOAL #1   Title  independent with HEP and understand how to progress herself and importance of contiuation    Time  12    Period  Weeks    Status  New    Target Date  02/21/18      PT LONG TERM GOAL #2   Title  urinary leakage with coughing, sneezing, and lifting decreased >/= 80% due to improved pelvic floor strength  >/= 4/5    Baseline  ---    Time  12    Period  Weeks    Status  New    Target Date  02/21/18      PT LONG TERM GOAL #3   Title  urinary leakage with fast walk decreased >/= 80% due to correct breathing pattern and increased pelvic floor strength     Time  12    Period  Weeks    Status  New    Target Date  02/21/18      PT LONG TERM GOAL #4   Title  sit with coccyx pain decreased >/= 75% due to improved elongation of muscles    Time  12    Period  Weeks    Status  New    Target Date  02/21/18      PT LONG TERM GOAL #5   Title  ability to fully empty her bladder and bowels due to ability to fully relax the pelvic floor muscles and contract the lower abdominals    Time  12    Period  Weeks    Status  New    Target Date  02/21/18            Plan -  12/12/17 1107    Clinical Impression Statement  Patient was not able to do her HEP due to being sick and constipated. After manual work patient was able to expand the abdomen full and greater ease.  Patient had reductioin of fascial restrictions after tissue work.  Patient has not met goals yet due to must starting therapy.  End of therapy patient was able to stand upright without difficulty. Patient reports reduction in coccyx pain. Patient will benefit  from skilled therapy to improve  pelvic floor strength and coordination to reduce urinary leakage and improve bowel function.     Rehab Potential  Excellent    Clinical Impairments Affecting Rehab Potential  right anterior THR 03/08/2017; Sigmoid cancer 10/2016    PT Frequency  1x / week    PT Duration  12 weeks    PT Treatment/Interventions  Biofeedback;Electrical Stimulation;Cryotherapy;Moist Heat;Ultrasound;Therapeutic activities;Therapeutic exercise;Manual techniques;Dry needling;Patient/family education;Neuromuscular re-education    PT Next Visit Plan  soft tissue work to pelvic floor and abdominals, work on pelvic floor contraction with tactile cues,  toileting technique    PT Home Exercise Plan  Access Code: [email or web address]     Recommended Other Services  MD signed intial evaluation    Consulted and Agree with Plan of Care  Patient       Patient will benefit from skilled therapeutic intervention in order to improve the following deficits and impairments:  Pain, Increased muscle spasms, Impaired flexibility, Decreased activity tolerance, Decreased endurance, Decreased strength, Decreased coordination  Visit Diagnosis: Muscle weakness (generalized)  Other lack of coordination  Coccyx pain     Problem List Patient Active Problem List   Diagnosis Date Noted  . Status post total replacement of right hip 03/08/2017  . Cancer of sigmoid colon ([place]) 11/09/2016  . Pain of right hip joint 09/19/2016  . Unilateral primary osteoarthritis, right hip 09/19/2016    Earlie Counts, PT 12/12/17 11:48 AM   [facility] [address], [place], [place], [address] Phone: [phone]   Fax:  [phone]  Name: Stacy [name] MRN: [id number] Date of Birth: 1966-01-01

## 2017-12-19 ENCOUNTER — Ambulatory Visit: Payer: BLUE CROSS/BLUE SHIELD | Admitting: Physical Therapy

## 2017-12-19 ENCOUNTER — Encounter: Payer: Self-pay | Admitting: Physical Therapy

## 2017-12-19 DIAGNOSIS — M6281 Muscle weakness (generalized): Secondary | ICD-10-CM

## 2017-12-19 DIAGNOSIS — M533 Sacrococcygeal disorders, not elsewhere classified: Secondary | ICD-10-CM

## 2017-12-19 DIAGNOSIS — R278 Other lack of coordination: Secondary | ICD-10-CM

## 2017-12-19 NOTE — Patient Instructions (Addendum)
Toileting Techniques for Bowel Movements (Defecation) Using your belly (abdomen) and pelvic floor muscles to have a bowel movement is usually instinctive.  Sometimes people can have problems with these muscles and have to relearn proper defecation (emptying) techniques.  If you have weakness in your muscles, organs that are falling out, decreased sensation in your pelvis, or ignore your urge to go, you may find yourself straining to have a bowel movement.  You are straining if you are: . holding your breath or taking in a huge gulp of air and holding it  . keeping your lips and jaw tensed and closed tightly . turning red in the face because of excessive pushing or forcing . developing or worsening your  hemorrhoids . getting faint while pushing . not emptying completely and have to defecate many times a day  If you are straining, you are actually making it harder for yourself to have a bowel movement.  Many people find they are pulling up with the pelvic floor muscles and closing off instead of opening the anus. Due to lack pelvic floor relaxation and coordination the abdominal muscles, one has to work harder to push the feces out.  Many people have never been taught how to defecate efficiently and effectively.  Notice what happens to your body when you are having a bowel movement.  While you are sitting on the toilet pay attention to the following areas: . Jaw and mouth position . Angle of your hips   . Whether your feet touch the ground or not . Arm placement  . Spine position . Waist . Belly tension . Anus (opening of the anal canal)  An Evacuation/Defecation Plan   Here are the 4 basic points:  1. Lean forward enough for your elbows to rest on your knees 2. Support your feet on the floor or use a low stool if your feet don't touch the floor  3. Push out your belly as if you have swallowed a beach ball-you should feel a widening of your waist 4. Open and relax your pelvic floor muscles,  rather than tightening around the anus      The following conditions my require modifications to your toileting posture:  . If you have had surgery in the past that limits your back, hip, pelvic, knee or ankle flexibility . Constipation   Your healthcare practitioner may make the following additional suggestions and adjustments:  1) Sit on the toilet  a) Make sure your feet are supported. b) Notice your hip angle and spine position-most people find it effective to lean forward or raise their knees, which can help the muscles around the anus to relax  c) When you lean forward, place your forearms on your thighs for support  2) Relax suggestions a) Breath deeply in through your nose and out slowly through your mouth as if you are smelling the flowers and blowing out the candles. b) To become aware of how to relax your muscles, contracting and releasing muscles can be helpful.  Pull your pelvic floor muscles in tightly by using the image of holding back gas, or closing around the anus (visualize making a circle smaller) and lifting the anus up and in.  Then release the muscles and your anus should drop down and feel open. Repeat 5 times ending with the feeling of relaxation. c) Keep your pelvic floor muscles relaxed; let your belly bulge out. d) The digestive tract starts at the mouth and ends at the anal opening, so be   sure to relax both ends of the tube.  Place your tongue on the roof of your mouth with your teeth separated.  This helps relax your mouth and will help to relax the anus at the same time.  3) Empty (defecation) a) Keep your pelvic floor and sphincter relaxed, then bulge your anal muscles.  Make the anal opening wide.  b) Stick your belly out as if you have swallowed a beach ball. c) Make your belly wall hard using your belly muscles while continuing to breathe. Doing this makes it easier to open your anus. d) Breath out and give a grunt (or try using other sounds such as  ahhhh, shhhhh, ohhhh or grrrrrrr).  4) Finish a) As you finish your bowel movement, pull the pelvic floor muscles up and in.  This will leave your anus in the proper place rather than remaining pushed out and down. If you leave your anus pushed out and down, it will start to feel as though that is normal and give you incorrect signals about needing to have a bowel movement.  We used a 6 inch step  Stacy [name], PT [facility] [address] [place], [place] [address] Quick Contraction: Gravity Eliminated (Hook-Lying)    Lie with hips and knees bent. Quickly squeeze then fully relax pelvic floor. Perform __1_ sets of ___. Rest for _1__ seconds between sets. Do _3__ times a day.  Make sure you keep her mouth open Copyright  VHI. All rights reserved.  Slow Contraction: Gravity Eliminated (Hook-Lying)    Lie with hips and knees bent. Slowly squeeze pelvic floor for _5__ seconds. Rest for _5__ seconds. Repeat _5__ times. Do _3__ times a day. Keep mouth open. You will feel the lower abdominals contract.   Copyright  VHI. All rights reserved.

## 2017-12-19 NOTE — Therapy (Signed)
[facility] [address], [place], [place], [address] Phone: [phone]   Fax:  [phone]  Physical Therapy Treatment  Patient Details  Name: Stacy [name] MRN: [id number] Date of Birth: [birth year] Referring Provider (PT): Dr. Stark Klein   Encounter Date: 12/19/2017  PT End of Session - 12/19/17 1141    Visit Number  3    Date for PT Re-Evaluation  02/21/18    Authorization Type  BCBS    PT Start Time  1100    PT Stop Time  1138    PT Time Calculation (min)  38 min    Activity Tolerance  Patient tolerated treatment well    Behavior During Therapy  [facility] for tasks assessed/performed       Past Medical History:  Diagnosis Date  . Arthritis    right hip  . Cancer [facility])    sigmoid colon cancer  . Family history of adverse reaction to anesthesia    father:confusion,disorientation  . GERD (gastroesophageal reflux disease)   . Hypertension     Past Surgical History:  Procedure Laterality Date  . adnoidectomy     as child  . LAPAROSCOPIC SIGMOID COLECTOMY N/A 11/09/2016   Procedure: LAPAROSCOPIC SIGMOID COLECTOMY;  Surgeon: Stark Klein, MD;  Location: [place];  Service: General;  Laterality: N/A;  . MULTIPLE TOOTH EXTRACTIONS     as a child  . MYRINGOTOMY     as a child  . PARTIAL COLECTOMY  11/09/2016   laparoscopic partial colectomy for sigmoid colon cancer  . SIGMOIDOSCOPY  03/01/2017   post-op eval   . TOOTH EXTRACTION    . TOTAL HIP ARTHROPLASTY Right 03/08/2017   Procedure: RIGHT TOTAL HIP ARTHROPLASTY ANTERIOR APPROACH;  Surgeon: Mcarthur Rossetti, MD;  Location[facility];  Service: Orthopedics;  Laterality: Right;    There were no vitals filed for this visit.  Subjective Assessment - 12/19/17 1102    Subjective  Things are going well. The constipation has been better. I am having a bowel movement and no straining. The coccyx pain is not great over the last few day. I continue to have  urinary leakage.     Patient Stated Goals  reduce urinary leakage    Currently in Pain?  Yes    Pain Score  1     Pain Location  Coccyx    Pain Orientation  Mid    Pain Descriptors / Indicators  Aching    Pain Type  Acute pain    Pain Onset  More than a month ago    Pain Frequency  Intermittent    Aggravating Factors   sit to stand activity, sitting to play an instrument on hard chair, sitting on a couch    Pain Relieving Factors  change postions, sit on a different hip; stand up and move around    Multiple Pain Sites  No                    Pelvic Floor Special Questions - 12/19/17 0001    Pelvic Floor Internal Exam  Patient confirms identification and approves PT to aseess muscles and treatment    Exam Type  Vaginal    Strength  fair squeeze, definite lift    Strength # of seconds  5        [facility] Adult PT Treatment/Exercise - 12/19/17 0001      Therapeutic Activites    Therapeutic Activities  Other Therapeutic Activities  Other Therapeutic Activities  correct toileting techique for bowel movements      Neuro Re-ed    Neuro Re-ed Details   facilitation to vaginal muscles to perform circular contraction with verbal cues to keep her mouth open to breath, lower abdominal bracing      Manual Therapy   Manual Therapy  Internal Pelvic Floor    Soft tissue mobilization  bilateral urethra sphincter, left side of  vaginal urethra, and levator ani             PT Education - 12/19/17 1110    Education provided  Yes    Education Details  toileting technique to no strain; pelvic floor strengthening in [employer]) Educated  Patient    Methods  Explanation;Demonstration;Verbal cues;Handout    Comprehension  Verbalized understanding;Returned demonstration       PT Short Term Goals - 12/19/17 1145      PT SHORT TERM GOAL #1   Title  independent with initial HEP    Time  4    Period  Weeks    Status  Achieved      PT SHORT TERM GOAL #2   Title   understand abdominal massage to improve bowel movements and abdominal tissue mobility    Time  4    Period  Weeks    Status  Achieved      PT SHORT TERM GOAL #3   Title  understand correct toileting technique for urination and bowel movements to fully empty bladder and rectum    Time  4    Period  Weeks    Status  On-going        PT Long Term Goals - 11/29/17 1119      PT LONG TERM GOAL #1   Title  independent with HEP and understand how to progress herself and importance of contiuation    Time  12    Period  Weeks    Status  New    Target Date  02/21/18      PT LONG TERM GOAL #2   Title  urinary leakage with coughing, sneezing, and lifting decreased >/= 80% due to improved pelvic floor strength  >/= 4/5    Baseline  ---    Time  12    Period  Weeks    Status  New    Target Date  02/21/18      PT LONG TERM GOAL #3   Title  urinary leakage with fast walk decreased >/= 80% due to correct breathing pattern and increased pelvic floor strength     Time  12    Period  Weeks    Status  New    Target Date  02/21/18      PT LONG TERM GOAL #4   Title  sit with coccyx pain decreased >/= 75% due to improved elongation of muscles    Time  12    Period  Weeks    Status  New    Target Date  02/21/18      PT LONG TERM GOAL #5   Title  ability to fully empty her bladder and bowels due to ability to fully relax the pelvic floor muscles and contract the lower abdominals    Time  12    Period  Weeks    Status  New    Target Date  02/21/18            Plan - 12/19/17 1105  Clinical Impression Statement  Patient understands how to toilet correctly with diaphragmatic breathing, relaxing her facial muscles and to contract lower abdominals.  Patient reports she has not had constipation or having to strain to have a bowel movement. Patient is still having coccy pain. Patient pelvic floor strength after manaul work increased to 3/5 holding for 5 seconds.  Patient will benefit from  skilled therapy to improve pelvic floor strength and coordination to reduce urinary leakage and improve bowel function.     Rehab Potential  Excellent    Clinical Impairments Affecting Rehab Potential  right anterior THR 03/08/2017; Sigmoid cancer 10/2016    PT Frequency  1x / week    PT Duration  12 weeks    PT Treatment/Interventions  Biofeedback;Electrical Stimulation;Cryotherapy;Moist Heat;Ultrasound;Therapeutic activities;Therapeutic exercise;Manual techniques;Dry needling;Patient/family education;Neuromuscular re-education    PT Next Visit Plan  soft tissue work to pelvic floor and abdominals, work on pelvic floor contraction with tactile cues; lift head and contract pelvic floor    PT Home Exercise Plan  Access Code: [email or web address]     Consulted and Agree with Plan of Care  Patient       Patient will benefit from skilled therapeutic intervention in order to improve the following deficits and impairments:  Pain, Increased muscle spasms, Impaired flexibility, Decreased activity tolerance, Decreased endurance, Decreased strength, Decreased coordination  Visit Diagnosis: Muscle weakness (generalized)  Other lack of coordination  Coccyx pain     Problem List Patient Active Problem List   Diagnosis Date Noted  . Status post total replacement of right hip 03/08/2017  . Cancer of sigmoid colon ([place]) 11/09/2016  . Pain of right hip joint 09/19/2016  . Unilateral primary osteoarthritis, right hip 09/19/2016    Earlie Counts, PT 12/19/17 11:47 AM    [facility] [address], [place], [place], [address] Phone: [phone]   Fax:  [phone]  Name: Stacy [name] MRN: [id number] Date of Birth: 08-Sep-1965

## 2017-12-25 ENCOUNTER — Encounter: Payer: Self-pay | Admitting: *Deleted

## 2017-12-25 NOTE — Progress Notes (Signed)
Last PAP: 10/04/16 - normal  Next due: 10/05/2019  [place]

## 2017-12-26 ENCOUNTER — Encounter: Payer: Self-pay | Admitting: Obstetrics & Gynecology

## 2017-12-26 ENCOUNTER — Ambulatory Visit (INDEPENDENT_AMBULATORY_CARE_PROVIDER_SITE_OTHER): Payer: BLUE CROSS/BLUE SHIELD | Admitting: Obstetrics & Gynecology

## 2017-12-26 VITALS — BP 147/68 | HR 97 | Resp 16 | Ht 66.0 in | Wt 167.0 lb

## 2017-12-26 DIAGNOSIS — Z01419 Encounter for gynecological examination (general) (routine) without abnormal findings: Secondary | ICD-10-CM | POA: Diagnosis not present

## 2017-12-26 DIAGNOSIS — Z23 Encounter for immunization: Secondary | ICD-10-CM | POA: Diagnosis not present

## 2017-12-26 DIAGNOSIS — N951 Menopausal and female climacteric states: Secondary | ICD-10-CM

## 2017-12-26 MED ORDER — CONJ ESTROG-MEDROXYPROGEST ACE 0.625-2.5 MG PO TABS: 1.0000 | ORAL_TABLET | Freq: Every day | ORAL | 4 refills | Status: DC

## 2017-12-26 NOTE — Progress Notes (Signed)
Subjective:     Stacy [name] is a 52 y.o. female here for a routine exam. G0  LMP 02/2016 Current complaints: since last visit pt had colon removed due to colon cancer. Pt is s/p a partial colectomy.  Pt is s/p right replacement. Pt has been in PT for urinary incontinence. Pt reports that if she misses the Prempro she may get a very slight amount of spotting.  Pt reports that as soon as she started the HRT her hot flashes and insomnia markedly improved and she feels great now.     Gynecologic History No LMP recorded. (Menstrual status: Perimenopausal). Contraception: post menopausal status Last Pap: 10/04/2016. Results were: normal Last mammogram: 09/17/2017. Results were: abnormal bi rad 3. Seen x 2 and lesion smaller.  For f/u in 1 year.   Obstetric History OB History  Gravida Para Term Preterm AB Living  0 0 0 0 0 0  SAB TAB Ectopic Multiple Live Births  0 0 0 0 0   The following portions of the patient's history were reviewed and updated as appropriate: allergies, current medications, past family history, past medical history, past social history, past surgical history and problem list.  Review of Systems Pertinent items are noted in HPI.    Objective:  BP (!) 147/68 (BP Location: Left Arm, Patient Position: Sitting, Cuff Size: Normal) Comment: Pt reports took BP med about 45 minutes ago  Pulse 97   Resp 16   Ht 5\' 6"  (1.676 m)   Wt 167 lb (75.8 kg)   BMI 26.95 kg/m   General Appearance:    Alert, cooperative, no distress, appears stated age  Head:    Normocephalic, without obvious abnormality, atraumatic  Eyes:    conjunctiva/corneas clear, EOM's intact, both eyes  Ears:    Normal external ear canals, both ears  Nose:   Nares normal, septum midline, mucosa normal, no drainage    or sinus tenderness  Throat:   Lips, mucosa, and tongue normal; teeth and gums normal  Neck:   Supple, symmetrical, trachea midline, no adenopathy;    thyroid:  no  enlargement/tenderness/nodules  Back:     Symmetric, no curvature, ROM normal, no CVA tenderness  Lungs:     Clear to auscultation bilaterally, respirations unlabored  Chest Wall:    No tenderness or deformity   Heart:    Regular rate and rhythm, S1 and S2 normal, no murmur, rub   or gallop  Breast Exam:    No tenderness, masses, or nipple abnormality  Abdomen:     Soft, non-tender, bowel sounds active all four quadrants,    no masses, no organomegaly; well healed lower transverse incision.   Genitalia:    Normal female without lesion, discharge or tenderness     Extremities:   Extremities normal, atraumatic, no cyanosis or edema  Pulses:   2+ and symmetric all extremities  Skin:   Skin color, texture, turgor normal, no rashes or lesions     Assessment:    Healthy female exam.   HRT counseling. Patient started on HRT due to bothersome menopausal vasomotor symptoms and insomnia. Sx. markedly improved. Discussed lifestyle interventions such as wearing light clothing, remaining in cool environments, having fan/air conditioner in the room, avoiding hot beverages etc.  Discussed using hormone therapy and concerns about increased risk of heart disease, cerebrovascular disease, thromboembolic disease,  and breast cancer. Pt wishes to cont Prempro.     Plan:    Follow up in: 1 year.  Refilled PremPro 0.625/2.5 mg daily Keep Pt for SUI F/u for repeat mammogram 09/2018 or sooner prn Tdap today Pt has already received flu vaccine.   Nocole Zammit L. Harraway-Smith, M.D., Cherlynn June

## 2017-12-26 NOTE — Patient Instructions (Signed)
Tdap Vaccine (Tetanus, Diphtheria and Pertussis): What You Need to Know 1. Why get vaccinated? Tetanus, diphtheria and pertussis are very serious diseases. Tdap vaccine can protect us from these diseases. And, Tdap vaccine given to pregnant women can protect newborn babies against pertussis. TETANUS (Lockjaw) is rare in the [place] today. It causes painful muscle tightening and stiffness, usually all over the body.  It can lead to tightening of muscles in the head and neck so you can't open your mouth, swallow, or sometimes even breathe. Tetanus kills about 1 out of 10 people who are infected even after receiving the best medical care.  DIPHTHERIA is also rare in the [place] today. It can cause a thick coating to form in the back of the throat.  It can lead to breathing problems, heart failure, paralysis, and death.  PERTUSSIS (Whooping Cough) causes severe coughing spells, which can cause difficulty breathing, vomiting and disturbed sleep.  It can also lead to weight loss, incontinence, and rib fractures. Up to 2 in 100 adolescents and 5 in 100 adults with pertussis are hospitalized or have complications, which could include pneumonia or death.  These diseases are caused by bacteria. Diphtheria and pertussis are spread from person to person through secretions from coughing or sneezing. Tetanus enters the body through cuts, scratches, or wounds. Before vaccines, as many as 200,000 cases of diphtheria, 200,000 cases of pertussis, and hundreds of cases of tetanus, were reported in the [place] each year. Since vaccination began, reports of cases for tetanus and diphtheria have dropped by about 99% and for pertussis by about 80%. 2. Tdap vaccine Tdap vaccine can protect adolescents and adults from tetanus, diphtheria, and pertussis. One dose of Tdap is routinely given at age 11 or 12. People who did not get Tdap at that age should get it as soon as possible. Tdap is especially  important for healthcare professionals and anyone having close contact with a baby younger than 12 months. Pregnant women should get a dose of Tdap during every pregnancy, to protect the newborn from pertussis. Infants are most at risk for severe, life-threatening complications from pertussis. Another vaccine, called Td, protects against tetanus and diphtheria, but not pertussis. A Td booster should be given every 10 years. Tdap may be given as one of these boosters if you have never gotten Tdap before. Tdap may also be given after a severe cut or burn to prevent tetanus infection. Your doctor or the person giving you the vaccine can give you more information. Tdap may safely be given at the same time as other vaccines. 3. Some people should not get this vaccine  A person who has ever had a life-threatening allergic reaction after a previous dose of any diphtheria, tetanus or pertussis containing vaccine, OR has a severe allergy to any part of this vaccine, should not get Tdap vaccine. Tell the person giving the vaccine about any severe allergies.  Anyone who had coma or long repeated seizures within 7 days after a childhood dose of DTP or DTaP, or a previous dose of Tdap, should not get Tdap, unless a cause other than the vaccine was found. They can still get Td.  Talk to your doctor if you: ? have seizures or another nervous system problem, ? had severe pain or swelling after any vaccine containing diphtheria, tetanus or pertussis, ? ever had a condition called Guillain-Barr Syndrome (GBS), ? aren't feeling well on the day the shot is scheduled. 4. Risks With any medicine, including   vaccines, there is a chance of side effects. These are usually mild and go away on their own. Serious reactions are also possible but are rare. Most people who get Tdap vaccine do not have any problems with it. Mild problems following Tdap: (Did not interfere with activities)  Pain where the shot was given (about  3 in 4 adolescents or 2 in 3 adults)  Redness or swelling where the shot was given (about 1 person in 5)  Mild fever of at least 100.4F (up to about 1 in 25 adolescents or 1 in 100 adults)  Headache (about 3 or 4 people in 10)  Tiredness (about 1 person in 3 or 4)  Nausea, vomiting, diarrhea, stomach ache (up to 1 in 4 adolescents or 1 in 10 adults)  Chills, sore joints (about 1 person in 10)  Body aches (about 1 person in 3 or 4)  Rash, swollen glands (uncommon)  Moderate problems following Tdap: (Interfered with activities, but did not require medical attention)  Pain where the shot was given (up to 1 in 5 or 6)  Redness or swelling where the shot was given (up to about 1 in 16 adolescents or 1 in 12 adults)  Fever over 102F (about 1 in 100 adolescents or 1 in 250 adults)  Headache (about 1 in 7 adolescents or 1 in 10 adults)  Nausea, vomiting, diarrhea, stomach ache (up to 1 or 3 people in 100)  Swelling of the entire arm where the shot was given (up to about 1 in 500).  Severe problems following Tdap: (Unable to perform usual activities; required medical attention)  Swelling, severe pain, bleeding and redness in the arm where the shot was given (rare).  Problems that could happen after any vaccine:  People sometimes faint after a medical procedure, including vaccination. Sitting or lying down for about 15 minutes can help prevent fainting, and injuries caused by a fall. Tell your doctor if you feel dizzy, or have vision changes or ringing in the ears.  Some people get severe pain in the shoulder and have difficulty moving the arm where a shot was given. This happens very rarely.  Any medication can cause a severe allergic reaction. Such reactions from a vaccine are very rare, estimated at fewer than 1 in a million doses, and would happen within a few minutes to a few hours after the vaccination. As with any medicine, there is a very remote chance of a vaccine  causing a serious injury or death. The safety of vaccines is always being monitored. For more information, visit: www.cdc.gov/vaccinesafety/ 5. What if there is a serious problem? What should I look for? Look for anything that concerns you, such as signs of a severe allergic reaction, very high fever, or unusual behavior. Signs of a severe allergic reaction can include hives, swelling of the face and throat, difficulty breathing, a fast heartbeat, dizziness, and weakness. These would usually start a few minutes to a few hours after the vaccination. What should I do?  If you think it is a severe allergic reaction or other emergency that can't wait, call 9-1-1 or get the person to the nearest hospital. Otherwise, call your doctor.  Afterward, the reaction should be reported to the Vaccine Adverse Event Reporting System (VAERS). Your doctor might file this report, or you can do it yourself through the VAERS web site at www.vaers.hhs.gov, or by calling 1-800-822-7967. ? VAERS does not give medical advice. 6. The National Vaccine Injury Compensation Program The National   Vaccine Injury Compensation Program (VICP) is a federal program that was created to compensate people who may have been injured by certain vaccines. Persons who believe they may have been injured by a vaccine can learn about the program and about filing a claim by calling 1-800-338-2382 or visiting the VICP website at www.hrsa.gov/vaccinecompensation. There is a time limit to file a claim for compensation. 7. How can I learn more?  Ask your doctor. He or she can give you the vaccine package insert or suggest other sources of information.  Call your local or state health department.  Contact the Centers for Disease Control and Prevention (CDC): ? Call 1-800-232-4636 (1-800-CDC-INFO) or ? Visit CDC's website at www.cdc.gov/vaccines CDC Tdap Vaccine VIS (05/06/13) This information is not intended to replace advice given to you by your  health care provider. Make sure you discuss any questions you have with your health care provider. Document Released: 08/29/2011 Document Revised: 11/18/2015 Document Reviewed: 11/18/2015 Elsevier Interactive Patient Education  2017 Elsevier Inc.  

## 2017-12-27 ENCOUNTER — Encounter: Payer: Self-pay | Admitting: Physical Therapy

## 2017-12-27 ENCOUNTER — Ambulatory Visit: Payer: BLUE CROSS/BLUE SHIELD | Admitting: Physical Therapy

## 2017-12-27 DIAGNOSIS — R278 Other lack of coordination: Secondary | ICD-10-CM

## 2017-12-27 DIAGNOSIS — M533 Sacrococcygeal disorders, not elsewhere classified: Secondary | ICD-10-CM

## 2017-12-27 DIAGNOSIS — M6281 Muscle weakness (generalized): Secondary | ICD-10-CM | POA: Diagnosis not present

## 2017-12-27 NOTE — Patient Instructions (Addendum)
Quick Contraction: Gravity Eliminated (Hook-Lying)    Lie with hips and knees bent. Quickly squeeze then fully relax pelvic floor. Perform __1_ sets of ___. Rest for _1__ seconds between sets. Do _3__ times a day.  Make sure you keep her mouth open Copyright  VHI. All rights reserved.  Slow Contraction: Gravity Eliminated (Hook-Lying)    Lie with hips and knees bent. Slowly squeeze pelvic floor for _5__ seconds. Rest for _5__ seconds. Repeat _5__ times. Do _3__ times a day. Keep mouth open. You will feel the lower abdominals contract.   Copyright  VHI. All rights reserved.   PNF Strengthening: Resisted   Standing with resistive band around each hand, bring right arm up and away, thumb back. Repeat _10___ times per set. Do _2___ sets per session. Do _1-2___ sessions per day.      Resisted Horizontal Abduction: Bilateral   Sit or stand, tubing in both hands, arms out in front. Keeping arms straight, pinch shoulder blades together and stretch arms out. Repeat _10___ times per set. Do 2____ sets per session. Do _1-2___ sessions per day. [facility] [address] [place], [place] [address] Phone # [phone] Fax [phone]

## 2017-12-27 NOTE — Therapy (Signed)
[facility] [address], [place], [place], [address] Phone: [phone]   Fax:  [phone]  Physical Therapy Treatment  Patient Details  Name: Stacy [name] MRN: [id number] Date of Birth: 02-13-1966 Referring Provider (PT): Dr. Stark Klein   Encounter Date: 12/27/2017  PT End of Session - 12/27/17 1026    Visit Number  4    Date for PT Re-Evaluation  02/21/18    Authorization Type  [employer]    PT Start Time  1020    PT Stop Time  1100    PT Time Calculation (min)  40 min    Activity Tolerance  Patient tolerated treatment well    Behavior During Therapy  [facility] for tasks assessed/performed       Past Medical History:  Diagnosis Date  . Arthritis    right hip  . Cancer [facility])    sigmoid colon cancer  . Family history of adverse reaction to anesthesia    father:confusion,disorientation  . GERD (gastroesophageal reflux disease)   . Hypertension     Past Surgical History:  Procedure Laterality Date  . adnoidectomy     as child  . LAPAROSCOPIC SIGMOID COLECTOMY N/A 11/09/2016   Procedure: LAPAROSCOPIC SIGMOID COLECTOMY;  Surgeon: Stark Klein, MD;  Location: [place];  Service: General;  Laterality: N/A;  . MULTIPLE TOOTH EXTRACTIONS     as a child  . MYRINGOTOMY     as a child  . PARTIAL COLECTOMY  11/09/2016   laparoscopic partial colectomy for sigmoid colon cancer  . SIGMOIDOSCOPY  03/01/2017   post-op eval   . TOOTH EXTRACTION    . TOTAL HIP ARTHROPLASTY Right 03/08/2017   Procedure: RIGHT TOTAL HIP ARTHROPLASTY ANTERIOR APPROACH;  Surgeon: Mcarthur Rossetti, MD;  Location: [facility];  Service: Orthopedics;  Laterality: Right;    There were no vitals filed for this visit.  Subjective Assessment - 12/27/17 1022    Subjective  I had a gynecology visit and the doctor is happy with everything. Constipation is doing better. Bowel movements are regluar and no discomfort. Coccyx pain comes and goes. I  bought a pad to sit on. I helps in the workshop. the urinary leakage may be slightly better.     Patient Stated Goals  reduce urinary leakage    Currently in Pain?  Yes    Pain Score  1    high is 6/10   Pain Location  Coccyx    Pain Orientation  Mid    Pain Descriptors / Indicators  Aching    Pain Type  Acute pain    Pain Onset  More than a month ago    Pain Frequency  Intermittent    Aggravating Factors   sit to stand activity, sitting to play an instrument on hard chair, sitting on a couch    Pain Relieving Factors  change positons, sit on a different hip; stand up and move around                    Pelvic Floor Special Questions - 12/27/17 0001    Pelvic Floor Internal Exam  Patient confirms identification and approves PT to aseess muscles and treatment    Exam Type  Vaginal        [facility] Adult PT Treatment/Exercise - 12/27/17 0001      Posture/Postural Control   Posture Comments  verbally educated patient on upright sitthg and standing posture with increased thoracic extension, anterior pelvic  tilt of the pelvis, and lifting her chest whick improves pelvic  floor contraction      Neuro Re-ed    Neuro Re-ed Details   manual facilitation of pelvic floor muscles to contract with lower abdominal contraction to improve lift of pelvic floor      Shoulder Exercises: Seated   Horizontal ABduction  Strengthening;Both;10 reps;Limitations    Theraband Level (Shoulder Horizontal ABduction)  Level 2 (Red)    Horizontal ABduction Limitations  needed therapist to place overpressure into the T6-T10 to promote extension    Diagonals  Strengthening;Both;10 reps    Theraband Level (Shoulder Diagonals)  Level 2 (Red)      Manual Therapy   Manual Therapy  Internal Pelvic Floor    Soft tissue mobilization  bilateral urethra sphincter, left side of  vaginal urethra, and levator ani and around the introitus             PT Education - 12/27/17 1102    Education provided  Yes     Education Details  posture education; interscapular strengthening; pelvic floor contraction with lower abdominal contraction    Person(s) Educated  Patient    Methods  Explanation;Demonstration;Verbal cues;Handout    Comprehension  Returned demonstration;Verbalized understanding       PT Short Term Goals - 12/27/17 1109      PT SHORT TERM GOAL #1   Title  independent with initial HEP    Time  4    Period  Weeks    Status  Achieved      PT SHORT TERM GOAL #2   Title  understand abdominal massage to improve bowel movements and abdominal tissue mobility    Time  4    Period  Weeks    Status  Achieved      PT SHORT TERM GOAL #3   Title  understand correct toileting technique for urination and bowel movements to fully empty bladder and rectum    Time  4    Period  Weeks    Status  Achieved        PT Long Term Goals - 11/29/17 1119      PT LONG TERM GOAL #1   Title  independent with HEP and understand how to progress herself and importance of contiuation    Time  12    Period  Weeks    Status  New    Target Date  02/21/18      PT LONG TERM GOAL #2   Title  urinary leakage with coughing, sneezing, and lifting decreased >/= 80% due to improved pelvic floor strength  >/= 4/5    Baseline  ---    Time  12    Period  Weeks    Status  New    Target Date  02/21/18      PT LONG TERM GOAL #3   Title  urinary leakage with fast walk decreased >/= 80% due to correct breathing pattern and increased pelvic floor strength     Time  12    Period  Weeks    Status  New    Target Date  02/21/18      PT LONG TERM GOAL #4   Title  sit with coccyx pain decreased >/= 75% due to improved elongation of muscles    Time  12    Period  Weeks    Status  New    Target Date  02/21/18      PT LONG TERM GOAL #  5   Title  ability to fully empty her bladder and bowels due to ability to fully relax the pelvic floor muscles and contract the lower abdominals    Time  12    Period  Weeks     Status  New    Target Date  02/21/18            Plan - 12/27/17 1026    Clinical Impression Statement  Patient is not having issues with constipation and pain with bowel movements.  Patient continues to sit in a slumped posture and is realizing how to stand and sit correctly.  Patient needed tactile cues to the lower abdomen to contraction with pelvic floor contraction. Patient pelvic floor strength is 3/5 holding for 3 seconds. Patient is able to breath with a pelvic floor contraction.  Patient will bulge her abdomen with laughing. Patient will benefit from skilled therapy to improve pelvic floor strength and coordination to reduce urinary leakage and improve bowel function.     Rehab Potential  Excellent    Clinical Impairments Affecting Rehab Potential  right anterior THR 03/08/2017; Sigmoid cancer 10/2016    PT Frequency  1x / week    PT Duration  12 weeks    PT Treatment/Interventions  Biofeedback;Electrical Stimulation;Cryotherapy;Moist Heat;Ultrasound;Therapeutic activities;Therapeutic exercise;Manual techniques;Dry needling;Patient/family education;Neuromuscular re-education    PT Next Visit Plan  soft tissue work to pelvic floor and abdominals, work on pelvic floor contraction with tactile cues; mobilize the thoracic to improve extension for correct posture    PT Home Exercise Plan  Access Code: [email or web address]     Consulted and Agree with Plan of Care  Patient       Patient will benefit from skilled therapeutic intervention in order to improve the following deficits and impairments:  Pain, Increased muscle spasms, Impaired flexibility, Decreased activity tolerance, Decreased endurance, Decreased strength, Decreased coordination  Visit Diagnosis: Muscle weakness (generalized)  Other lack of coordination  Coccyx pain     Problem List Patient Active Problem List   Diagnosis Date Noted  . Status post total replacement of right hip 03/08/2017  . Cancer of sigmoid colon ([place])  11/09/2016  . Pain of right hip joint 09/19/2016  . Unilateral primary osteoarthritis, right hip 09/19/2016    Earlie Counts, PT 12/27/17 11:11 AM   [facility] [address], [place], [place], [address] Phone: [phone]   Fax:  [phone]  Name: Stacy [name] MRN: [id number] Date of Birth: Aug 09, 1965

## 2018-01-02 ENCOUNTER — Ambulatory Visit: Payer: BLUE CROSS/BLUE SHIELD | Admitting: Physical Therapy

## 2018-01-02 ENCOUNTER — Encounter: Payer: Self-pay | Admitting: Physical Therapy

## 2018-01-02 DIAGNOSIS — M533 Sacrococcygeal disorders, not elsewhere classified: Secondary | ICD-10-CM

## 2018-01-02 DIAGNOSIS — M6281 Muscle weakness (generalized): Secondary | ICD-10-CM

## 2018-01-02 DIAGNOSIS — R278 Other lack of coordination: Secondary | ICD-10-CM

## 2018-01-02 NOTE — Therapy (Signed)
[facility] [address] [place], [place], [address] Phone: [phone]   Fax:  [phone]  Physical Therapy Treatment  Patient Details  Name: Stacy [name] MRN: [id number] Date of Birth: 08/11/1965 Referring Provider (PT): Dr. Stark Klein   Encounter Date: 01/02/2018  PT End of Session - 01/02/18 1108    Visit Number  5    Date for PT Re-Evaluation  02/21/18    Authorization Type  BCBS    PT Start Time  1100    PT Stop Time  1140    PT Time Calculation (min)  40 min    Activity Tolerance  Patient tolerated treatment well    Behavior During Therapy  [facility] for tasks assessed/performed       Past Medical History:  Diagnosis Date  . Arthritis    right hip  . Cancer [facility])    sigmoid colon cancer  . Family history of adverse reaction to anesthesia    father:confusion,disorientation  . GERD (gastroesophageal reflux disease)   . Hypertension     Past Surgical History:  Procedure Laterality Date  . adnoidectomy     as child  . LAPAROSCOPIC SIGMOID COLECTOMY N/A 11/09/2016   Procedure: LAPAROSCOPIC SIGMOID COLECTOMY;  Surgeon: Stark Klein, MD;  Location: [place];  Service: General;  Laterality: N/A;  . MULTIPLE TOOTH EXTRACTIONS     as a child  . MYRINGOTOMY     as a child  . PARTIAL COLECTOMY  11/09/2016   laparoscopic partial colectomy for sigmoid colon cancer  . SIGMOIDOSCOPY  03/01/2017   post-op eval   . TOOTH EXTRACTION    . TOTAL HIP ARTHROPLASTY Right 03/08/2017   Procedure: RIGHT TOTAL HIP ARTHROPLASTY ANTERIOR APPROACH;  Surgeon: Mcarthur Rossetti, MD;  Location[facility];  Service: Orthopedics;  Laterality: Right;    There were no vitals filed for this visit.  Subjective Assessment - 01/02/18 1104    Subjective  I am starting to see slow improvement with the leakage and less leakage when it happens. Urinary leakage has improved by 25%. Yesterday I had alot of coccyx pain. My chair pad  helps some.     Patient Stated Goals  reduce urinary leakage    Currently in Pain?  Yes    Pain Score  5     Pain Location  Coccyx    Pain Orientation  Mid    Pain Descriptors / Indicators  Aching    Pain Type  Acute pain    Pain Onset  More than a month ago    Pain Frequency  Intermittent    Aggravating Factors   sit to stand activity, sitting to play an instrument on hard chair, sitting on a couch    Pain Relieving Factors  change positions, sit on a different hip; stand up and move around    Multiple Pain Sites  No                       [facility] Adult PT Treatment/Exercise - 01/02/18 0001      Neuro Re-ed    Neuro Re-ed Details   sit on green physioball-pelvic circles, pelvic sway, pelvic tilt      Exercises   Exercises  Lumbar      Lumbar Exercises: Seated   Sit to Stand  5 reps   from green physioball   Sit to Stand Limitations  10 times with therapist fingers anterior to the coccyx and  along the perineum    Other Seated Lumbar Exercises  sit to stand 5 times with increased thoracic extension and hip flexion      Manual Therapy   Manual Therapy  Soft tissue mobilization    Manual therapy comments  place one finger anterior to the coccyx and along the perinuem as the patient rocks side to side, anterior and posterior tilt, and trunk rotation    Soft tissue mobilization  around the right hip, right gluteals, right piriformis, bil. coccygeus, bil. levator ani in sidely               PT Short Term Goals - 12/27/17 1109      PT SHORT TERM GOAL #1   Title  independent with initial HEP    Time  4    Period  Weeks    Status  Achieved      PT SHORT TERM GOAL #2   Title  understand abdominal massage to improve bowel movements and abdominal tissue mobility    Time  4    Period  Weeks    Status  Achieved      PT SHORT TERM GOAL #3   Title  understand correct toileting technique for urination and bowel movements to fully empty bladder and rectum     Time  4    Period  Weeks    Status  Achieved        PT Long Term Goals - 01/02/18 1144      PT LONG TERM GOAL #1   Title  independent with HEP and understand how to progress herself and importance of contiuation    Time  12    Period  Weeks    Status  On-going      PT LONG TERM GOAL #2   Title  urinary leakage with coughing, sneezing, and lifting decreased >/= 80% due to improved pelvic floor strength  >/= 4/5    Baseline  improved by 25%    Time  12    Period  Weeks    Status  On-going      PT LONG TERM GOAL #3   Title  urinary leakage with fast walk decreased >/= 80% due to correct breathing pattern and increased pelvic floor strength     Baseline  improved by 25%    Time  12    Period  Weeks    Status  On-going      PT LONG TERM GOAL #4   Title  sit with coccyx pain decreased >/= 75% due to improved elongation of muscles    Time  12    Period  Weeks    Status  On-going      PT LONG TERM GOAL #5   Title  ability to fully empty her bladder and bowels due to ability to fully relax the pelvic floor muscles and contract the lower abdominals    Time  12    Period  Weeks    Status  On-going            Plan - 01/02/18 1141    Clinical Impression Statement  Patient reports less leakage by 25%. Patient is still having the coccyx pain.  Patient will still sit in a slumped posture during therapy. After soft tissue work she had no coccyx pain going from sit to stand.  Patient felt she was able to contract the pelvic floor correctly after soft tissue work. Patient will benefit from skilled therapy to  improve pelvic floor strength and coordination to reduce urinary leakage and improve bowel function.     Rehab Potential  Excellent    Clinical Impairments Affecting Rehab Potential  right anterior THR 03/08/2017; Sigmoid cancer 10/2016    PT Frequency  1x / week    PT Duration  12 weeks    PT Treatment/Interventions  Biofeedback;Electrical Stimulation;Cryotherapy;Moist  Heat;Ultrasound;Therapeutic activities;Therapeutic exercise;Manual techniques;Dry needling;Patient/family education;Neuromuscular re-education    PT Next Visit Plan  pelvic floor EMG; assess coccyx pain    PT Home Exercise Plan  Access Code: [email or web address]     Consulted and Agree with Plan of Care  Patient       Patient will benefit from skilled therapeutic intervention in order to improve the following deficits and impairments:  Pain, Increased muscle spasms, Impaired flexibility, Decreased activity tolerance, Decreased endurance, Decreased strength, Decreased coordination  Visit Diagnosis: Muscle weakness (generalized)  Other lack of coordination  Coccyx pain     Problem List Patient Active Problem List   Diagnosis Date Noted  . Status post total replacement of right hip 03/08/2017  . Cancer of sigmoid colon ([place]) 11/09/2016  . Pain of right hip joint 09/19/2016  . Unilateral primary osteoarthritis, right hip 09/19/2016    Earlie Counts, PT 01/02/18 11:46 AM   [facility] [address], [place], [place], [address] Phone: [phone]   Fax:  [phone]  Name: Stacy [name] MRN: [id number] Date of Birth: August 25, 1965

## 2018-01-09 ENCOUNTER — Ambulatory Visit: Payer: BLUE CROSS/BLUE SHIELD | Admitting: Physical Therapy

## 2018-01-09 ENCOUNTER — Encounter: Payer: Self-pay | Admitting: Physical Therapy

## 2018-01-09 DIAGNOSIS — M6281 Muscle weakness (generalized): Secondary | ICD-10-CM | POA: Diagnosis not present

## 2018-01-09 DIAGNOSIS — R278 Other lack of coordination: Secondary | ICD-10-CM

## 2018-01-09 NOTE — Therapy (Signed)
[facility] [address], [place], [place], [address] Phone: [phone]   Fax:  [phone]  Physical Therapy Treatment  Patient Details  Name: Stacy [name] MRN: [id number] Date of Birth: 1965-12-25 Referring Provider (PT): Dr. Stark Klein   Encounter Date: 01/09/2018  PT End of Session - 01/09/18 1108    Visit Number  6    Date for PT Re-Evaluation  02/21/18    Authorization Type  BCBS    PT Start Time  1100    PT Stop Time  1140    PT Time Calculation (min)  40 min    Activity Tolerance  Patient tolerated treatment well    Behavior During Therapy  [facility] for tasks assessed/performed       Past Medical History:  Diagnosis Date  . Arthritis    right hip  . Cancer [facility])    sigmoid colon cancer  . Family history of adverse reaction to anesthesia    father:confusion,disorientation  . GERD (gastroesophageal reflux disease)   . Hypertension     Past Surgical History:  Procedure Laterality Date  . adnoidectomy     as child  . LAPAROSCOPIC SIGMOID COLECTOMY N/A 11/09/2016   Procedure: LAPAROSCOPIC SIGMOID COLECTOMY;  Surgeon: Stark Klein, MD;  Location: [place];  Service: General;  Laterality: N/A;  . MULTIPLE TOOTH EXTRACTIONS     as a child  . MYRINGOTOMY     as a child  . PARTIAL COLECTOMY  11/09/2016   laparoscopic partial colectomy for sigmoid colon cancer  . SIGMOIDOSCOPY  03/01/2017   post-op eval   . TOOTH EXTRACTION    . TOTAL HIP ARTHROPLASTY Right 03/08/2017   Procedure: RIGHT TOTAL HIP ARTHROPLASTY ANTERIOR APPROACH;  Surgeon: Mcarthur Rossetti, MD;  [facility];  Service: Orthopedics;  Laterality: Right;    There were no vitals filed for this visit.  Subjective Assessment - 01/09/18 1104    Subjective  I am slightly better. I notice improvement in sneezing and coughing incontinence. I do not loos urine everytime. I am 25% better. The tailbone pain is better. The exercises  with the theraband has helped my upper back pain. Patient reports a 5/10 coccyx pain is not as frequent.     Patient Stated Goals  reduce urinary leakage    Currently in Pain?  Yes    Pain Score  5     Pain Orientation  Mid    Pain Descriptors / Indicators  Aching    Pain Onset  More than a month ago    Pain Frequency  Intermittent    Aggravating Factors   sit to stand, sitting to play an intrument on hard chair, sitting on a couch    Pain Relieving Factors  change postitions, sit on a different hip, stand up and move around    Multiple Pain Sites  No                    Pelvic Floor Special Questions - 01/09/18 0001    Biofeedback  sit to stand and then relax the pelvic floor, sitting resting tone 5 uv then did diaphragmatic breathing to reduce tone to  3 uv, sit and contract for 10 sec. 10 uv, 30 sec had trouble breathing and will decline after 20 sec; sitting working on ramp    Biofeedback sensor type  Surface   vaginal   Biofeedback Activity  Bulging;Quick contraction;10 second hold  PT Education - 01/09/18 1139    Education provided  Yes    Education Details  pelvic floor contraction in sitting; sit on tennis ball and massage pelvic floor    Person(s) Educated  Patient    Methods  Explanation;Demonstration;Verbal cues;Handout    Comprehension  Returned demonstration;Verbalized understanding       PT Short Term Goals - 12/27/17 1109      PT SHORT TERM GOAL #1   Title  independent with initial HEP    Time  4    Period  Weeks    Status  Achieved      PT SHORT TERM GOAL #2   Title  understand abdominal massage to improve bowel movements and abdominal tissue mobility    Time  4    Period  Weeks    Status  Achieved      PT SHORT TERM GOAL #3   Title  understand correct toileting technique for urination and bowel movements to fully empty bladder and rectum    Time  4    Period  Weeks    Status  Achieved        PT Long Term Goals -  01/09/18 1140      PT LONG TERM GOAL #1   Title  independent with HEP and understand how to progress herself and importance of contiuation    Baseline  still learning    Time  12    Period  Weeks    Status  On-going      PT LONG TERM GOAL #2   Title  urinary leakage with coughing, sneezing, and lifting decreased >/= 80% due to improved pelvic floor strength  >/= 4/5    Baseline  improved by 25%    Time  12    Period  Weeks    Status  On-going      PT LONG TERM GOAL #3   Title  urinary leakage with fast walk decreased >/= 80% due to correct breathing pattern and increased pelvic floor strength     Baseline  improved by 25%    Time  12    Period  Weeks    Status  On-going      PT LONG TERM GOAL #4   Title  sit with coccyx pain decreased >/= 75% due to improved elongation of muscles    Baseline  less time with coccyx pain at level 5/10    Time  12    Period  Weeks    Status  On-going      PT LONG TERM GOAL #5   Title  ability to fully empty her bladder and bowels due to ability to fully relax the pelvic floor muscles and contract the lower abdominals    Time  12    Period  Weeks    Status  On-going            Plan - 01/09/18 1108    Clinical Impression Statement  Patient reports urinary leakage is 25% better. She is leaking less with coughing and sneezing.  Patient has less times with 5/10 coccyx pain.  Patient is abl eto contract the pelvic floor for 22 seconds at 6 uv and rest at 3 uv. Patietn is able to relax her pelvic floor in sitting with diaphgramatic breathing.  Patient will hold her breath when holding her pelvic floor contraction with endurance.  Patient is able to ramp up her pelvic floor contraction with good control in sitting. Patient  has difficulty with relaxing the pelvic floor after she stands up from a sitting position. Patient will benefit  from skilled therapy to imporve pelvic floor strength and coordination to reduce urinary leakage and improve bowel  function.     Rehab Potential  Excellent    Clinical Impairments Affecting Rehab Potential  right anterior THR 03/08/2017; Sigmoid cancer 10/2016    PT Frequency  1x / week    PT Duration  12 weeks    PT Treatment/Interventions  Biofeedback;Electrical Stimulation;Cryotherapy;Moist Heat;Ultrasound;Therapeutic activities;Therapeutic exercise;Manual techniques;Dry needling;Patient/family education;Neuromuscular re-education    PT Next Visit Plan  pelvic floor EMG; assess coccyx pain    PT Home Exercise Plan  Access Code: [email or web address]     Consulted and Agree with Plan of Care  Patient       Patient will benefit from skilled therapeutic intervention in order to improve the following deficits and impairments:  Pain, Increased muscle spasms, Impaired flexibility, Decreased activity tolerance, Decreased endurance, Decreased strength, Decreased coordination  Visit Diagnosis: Muscle weakness (generalized)  Other lack of coordination     Problem List Patient Active Problem List   Diagnosis Date Noted  . Status post total replacement of right hip 03/08/2017  . Cancer of sigmoid colon ([place]) 11/09/2016  . Pain of right hip joint 09/19/2016  . Unilateral primary osteoarthritis, right hip 09/19/2016    Earlie Counts, PT 01/09/18 11:41 AM   [facility] [address] [place], [place], [address] Phone: [phone]   Fax:  [phone]  Name: Stacy [name] MRN: [id number] Date of Birth: 1965/10/07

## 2018-01-09 NOTE — Patient Instructions (Addendum)
  Slow Contraction: Gravity Resisted (Sitting)    Sitting, slowly squeeze pelvic floor for _30__ seconds. Rest for 10___ seconds. Repeat _5__ times. Do __1_ times a day. Next exercise is to ramp up the pelvic floor for 10 seconds 5 times 1 time per day.  Copyright  VHI. All rights reserved.  [facility] [address], [place], [place] [address] Phone # [phone] Fax [phone]

## 2018-01-24 ENCOUNTER — Ambulatory Visit: Payer: BLUE CROSS/BLUE SHIELD | Attending: General Surgery | Admitting: Physical Therapy

## 2018-01-24 ENCOUNTER — Encounter: Payer: Self-pay | Admitting: Physical Therapy

## 2018-01-24 DIAGNOSIS — M6281 Muscle weakness (generalized): Secondary | ICD-10-CM

## 2018-01-24 DIAGNOSIS — R278 Other lack of coordination: Secondary | ICD-10-CM

## 2018-01-24 DIAGNOSIS — M533 Sacrococcygeal disorders, not elsewhere classified: Secondary | ICD-10-CM | POA: Diagnosis present

## 2018-01-24 NOTE — Patient Instructions (Addendum)
Cough: Phase 4 (Sitting)    Squeeze pelvic floor and hold. Inhale. Lean shoulders forward. Cough repeatedly. Relax. Repeat _5__ times. Do _1__ times a day.  Copyright  VHI. All rights reserved.  Cough: Phase 4 (Standing)    Squeeze pelvic floor and hold. Inhale. Lean trunk  forward. Cough repeatedly. Relax. Repeat _5__ times. Do ___ times a day.  Copyright  VHI. All rights reserved.  Slow Contraction: Gravity Resisted (Sitting)    Sitting, slowly squeeze pelvic floor for _20__ seconds. Rest for _10__ seconds. Repeat __3_ times. Do _1__ times a day.  Copyright  VHI. All rights reserved.  Slow Contraction: Gravity Resisted (Sitting)    Sitting, slowly squeeze pelvic floor for _20__ seconds. Rest for __10_ seconds. Repeat __3_ times. Do __1_ times a day.  Copyright  VHI. All rights reserved.  [facility] [address] [place], [place] [address] Phone # [phone] Fax [phone]

## 2018-01-24 NOTE — Therapy (Signed)
[facility] [address] [place], [place], [address] Phone: [phone]   Fax:  [phone]  Physical Therapy Treatment  Patient Details  Name: Stacy [name] MRN: [id number] Date of Birth: November 25, 1965 Referring Provider (PT): Dr. Stark Klein   Encounter Date: 01/24/2018  PT End of Session - 01/24/18 1108    Visit Number  7    Date for PT Re-Evaluation  02/21/18    Authorization Type  BCBS    PT Start Time  1100    PT Stop Time  1140    PT Time Calculation (min)  40 min    Activity Tolerance  Patient tolerated treatment well    Behavior During Therapy  [facility] for tasks assessed/performed       Past Medical History:  Diagnosis Date  . Arthritis    right hip  . Cancer [facility])    sigmoid colon cancer  . Family history of adverse reaction to anesthesia    father:confusion,disorientation  . GERD (gastroesophageal reflux disease)   . Hypertension     Past Surgical History:  Procedure Laterality Date  . adnoidectomy     as child  . LAPAROSCOPIC SIGMOID COLECTOMY N/A 11/09/2016   Procedure: LAPAROSCOPIC SIGMOID COLECTOMY;  Surgeon: Stark Klein, MD;  Location: [facility];  Service: General;  Laterality: N/A;  . MULTIPLE TOOTH EXTRACTIONS     as a child  . MYRINGOTOMY     as a child  . PARTIAL COLECTOMY  11/09/2016   laparoscopic partial colectomy for sigmoid colon cancer  . SIGMOIDOSCOPY  03/01/2017   post-op eval   . TOOTH EXTRACTION    . TOTAL HIP ARTHROPLASTY Right 03/08/2017   Procedure: RIGHT TOTAL HIP ARTHROPLASTY ANTERIOR APPROACH;  Surgeon: Mcarthur Rossetti, MD;  [facility];  Service: Orthopedics;  Laterality: Right;    There were no vitals filed for this visit.  Subjective Assessment - 01/24/18 1103    Subjective  I am having less trouble with sneeze and cough incontinece and is 50% better. Most coccyx pain is sit to stand and happens less frequently.     Patient Stated Goals  reduce  urinary leakage    Currently in Pain?  Yes    Pain Score  5     Pain Location  Coccyx    Pain Orientation  Mid    Pain Descriptors / Indicators  Aching    Pain Type  Acute pain    Pain Onset  More than a month ago    Pain Frequency  Intermittent    Aggravating Factors   sit to stand, sitting to play an instrument on hard chair, sitting on a couch    Pain Relieving Factors  change positions, sit on a different hip, stand up and move around    Multiple Pain Sites  No                    Pelvic Floor Special Questions - 01/24/18 0001    Biofeedback  sit resting tone 2 uv, sitting quick contraction 18 uv; 20 second sit 14 uv    Biofeedback sensor type  Surface   vaginal   Biofeedback Activity  Other;10 second hold;Quick contraction   quick then hold; cough sit and stand, sit to stand       [facility] Adult PT Treatment/Exercise - 01/24/18 0001      Manual Therapy   Manual Therapy  Joint mobilization;Soft tissue mobilization    Joint Mobilization  upslide and downslide glide of the L1-L5 in sidely; mobilization of the sacrum to stretch the Lumbar sacral area in sidely    Soft tissue mobilization  lumbar paraspinals             PT Education - 01/24/18 1133    Education provided  Yes    Education Details  pelvic floor contraction with coughing and 20 seconds hold    Person(s) Educated  Patient    Methods  Explanation;Demonstration;Verbal cues;Handout    Comprehension  Returned demonstration;Verbalized understanding       PT Short Term Goals - 12/27/17 1109      PT SHORT TERM GOAL #1   Title  independent with initial HEP    Time  4    Period  Weeks    Status  Achieved      PT SHORT TERM GOAL #2   Title  understand abdominal massage to improve bowel movements and abdominal tissue mobility    Time  4    Period  Weeks    Status  Achieved      PT SHORT TERM GOAL #3   Title  understand correct toileting technique for urination and bowel movements to fully  empty bladder and rectum    Time  4    Period  Weeks    Status  Achieved        PT Long Term Goals - 01/24/18 1144      PT LONG TERM GOAL #1   Title  independent with HEP and understand how to progress herself and importance of contiuation    Baseline  still learning    Time  12    Period  Weeks    Status  On-going      PT LONG TERM GOAL #2   Title  urinary leakage with coughing, sneezing, and lifting decreased >/= 80% due to improved pelvic floor strength  >/= 4/5    Baseline  improved by 50%    Time  12    Period  Weeks    Status  On-going      PT LONG TERM GOAL #3   Title  urinary leakage with fast walk decreased >/= 80% due to correct breathing pattern and increased pelvic floor strength     Baseline  improved by 50%    Time  12    Period  Weeks    Status  On-going      PT LONG TERM GOAL #4   Title  sit with coccyx pain decreased >/= 75% due to improved elongation of muscles    Baseline  less time with coccyx pain at level 5/10    Time  12    Period  Weeks    Status  On-going      PT LONG TERM GOAL #5   Title  ability to fully empty her bladder and bowels due to ability to fully relax the pelvic floor muscles and contract the lower abdominals    Time  12    Period  Weeks    Status  Achieved            Plan - 01/24/18 1109    Clinical Impression Statement  After treatement patient able to go from sit to stand without pain in the coccyx area due to improve lumbar sacral mobility. Patient is able to contract the pelvic floor and full relax to 2 uv now.  Patient is able to hold pelvic floor contraction for 20 seconds but  has dificulty with breathing. Patient is able to contract the pelvic floor with coughing in sitting and standing. Patient reports urinary leakage is 50% better. Patient will benefit from skilled therapy to improve pelvic floor strength and coordination to reduce urinary leakage and improve bowel health.     Rehab Potential  Excellent    Clinical  Impairments Affecting Rehab Potential  right anterior THR 03/08/2017; Sigmoid cancer 10/2016    PT Frequency  1x / week    PT Duration  12 weeks    PT Treatment/Interventions  Biofeedback;Electrical Stimulation;Cryotherapy;Moist Heat;Ultrasound;Therapeutic activities;Therapeutic exercise;Manual techniques;Dry needling;Patient/family education;Neuromuscular re-education    PT Next Visit Plan  pelvic floor EMG with tranistional movement and coordination; assess coccyx pain    PT Home Exercise Plan  Access Code: [email or web address]     Consulted and Agree with Plan of Care  Patient       Patient will benefit from skilled therapeutic intervention in order to improve the following deficits and impairments:  Pain, Increased muscle spasms, Impaired flexibility, Decreased activity tolerance, Decreased endurance, Decreased strength, Decreased coordination  Visit Diagnosis: Muscle weakness (generalized)  Other lack of coordination  Coccyx pain     Problem List Patient Active Problem List   Diagnosis Date Noted  . Status post total replacement of right hip 03/08/2017  . Cancer of sigmoid colon ([place]) 11/09/2016  . Pain of right hip joint 09/19/2016  . Unilateral primary osteoarthritis, right hip 09/19/2016    Earlie Counts, PT 01/24/18 11:46 AM   [facility] [address] [place], [place], [address] Phone: [phone]   Fax:  [phone]  Name: Stacy [name] MRN: [id number] Date of Birth: May 03, 1965

## 2018-01-31 ENCOUNTER — Encounter: Payer: Self-pay | Admitting: Physical Therapy

## 2018-01-31 ENCOUNTER — Ambulatory Visit: Payer: BLUE CROSS/BLUE SHIELD | Admitting: Physical Therapy

## 2018-01-31 DIAGNOSIS — M533 Sacrococcygeal disorders, not elsewhere classified: Secondary | ICD-10-CM

## 2018-01-31 DIAGNOSIS — R278 Other lack of coordination: Secondary | ICD-10-CM

## 2018-01-31 DIAGNOSIS — M6281 Muscle weakness (generalized): Secondary | ICD-10-CM

## 2018-01-31 NOTE — Therapy (Signed)
[facility] [address] [place], [place], [address] Phone: [phone]   Fax:  [phone]  Physical Therapy Treatment  Patient Details  Name: Stacy [name] MRN: [id number] Date of Birth: 02-14-1966 Referring Provider (PT): Dr. Stark Klein   Encounter Date: 01/31/2018  PT End of Session - 01/31/18 1110    Visit Number  8    Date for PT Re-Evaluation  02/21/18    Authorization Type  BCBS    PT Start Time  1100    PT Stop Time  1140    PT Time Calculation (min)  40 min    Activity Tolerance  Patient tolerated treatment well    Behavior During Therapy  [facility] for tasks assessed/performed       Past Medical History:  Diagnosis Date  . Arthritis    right hip  . Cancer [facility])    sigmoid colon cancer  . Family history of adverse reaction to anesthesia    father:confusion,disorientation  . GERD (gastroesophageal reflux disease)   . Hypertension     Past Surgical History:  Procedure Laterality Date  . adnoidectomy     as child  . LAPAROSCOPIC SIGMOID COLECTOMY N/A 11/09/2016   Procedure: LAPAROSCOPIC SIGMOID COLECTOMY;  Surgeon: Stark Klein, MD;  Location: [place];  Service: General;  Laterality: N/A;  . MULTIPLE TOOTH EXTRACTIONS     as a child  . MYRINGOTOMY     as a child  . PARTIAL COLECTOMY  11/09/2016   laparoscopic partial colectomy for sigmoid colon cancer  . SIGMOIDOSCOPY  03/01/2017   post-op eval   . TOOTH EXTRACTION    . TOTAL HIP ARTHROPLASTY Right 03/08/2017   Procedure: RIGHT TOTAL HIP ARTHROPLASTY ANTERIOR APPROACH;  Surgeon: Mcarthur Rossetti, MD;  Location: [facility];  Service: Orthopedics;  Laterality: Right;    There were no vitals filed for this visit.  Subjective Assessment - 01/31/18 1106    Subjective  I have had more better days with coccyx pain. The urinary leakage with sneeze and cough is 50% better.     Patient Stated Goals  reduce urinary leakage    Currently in Pain?   Yes    Pain Score  5     Pain Location  Coccyx    Pain Orientation  Mid    Pain Descriptors / Indicators  Aching    Pain Type  Acute pain    Pain Onset  More than a month ago    Pain Frequency  Intermittent    Aggravating Factors   sit to stand, sitting to play an instrument on hard chair, sitting on a couch    Pain Relieving Factors  change positions, sit on a different hip, stand up and move around    Multiple Pain Sites  No         [facility] PT Assessment - 01/31/18 0001      Strength   Right Hip Extension  5/5    Right Hip ABduction  4+/5    Left Hip Extension  5/5    Left Hip ABduction  4+/5                   [facility] Adult PT Treatment/Exercise - 01/31/18 0001      Therapeutic Activites    Therapeutic Activities  Other Therapeutic Activities    Other Therapeutic Activities  postural awareness in sitting and standing      Exercises   Exercises  Lumbar  Lumbar Exercises: Stretches   Active Hamstring Stretch  Right;Left;1 rep;30 seconds   supine each leg   Pelvic Tilt  15 reps   improve lumbar mobility   Piriformis Stretch  Right;Left;1 rep;30 seconds    Other Lumbar Stretch Exercise  pelvic circles supine, then pelvic floor diagonals in supine with tactile cues      Lumbar Exercises: Seated   Sit to Stand  15 reps   with lumbar lordosis and abdominal bracing     Lumbar Exercises: Supine   Ab Set  15 reps;3 seconds    AB Set Limitations  tactile cues to depress the lower rib cage, VC to breath      Manual Therapy   Manual Therapy  Soft tissue mobilization;Joint mobilization    Joint Mobilization  lower rib cage mobilization    Soft tissue mobilization  right SI joint, gluteals, coccygeus, piriformis; soft tissue work to to left thoracic lumbar junction             PT Education - 01/31/18 1140    Education provided  Yes    Education Details  postural awareness in sitting and standing    Person(s) Educated  Patient    Methods   Explanation;Demonstration    Comprehension  Returned demonstration;Verbalized understanding       PT Short Term Goals - 12/27/17 1109      PT SHORT TERM GOAL #1   Title  independent with initial HEP    Time  4    Period  Weeks    Status  Achieved      PT SHORT TERM GOAL #2   Title  understand abdominal massage to improve bowel movements and abdominal tissue mobility    Time  4    Period  Weeks    Status  Achieved      PT SHORT TERM GOAL #3   Title  understand correct toileting technique for urination and bowel movements to fully empty bladder and rectum    Time  4    Period  Weeks    Status  Achieved        PT Long Term Goals - 01/31/18 1143      PT LONG TERM GOAL #1   Title  independent with HEP and understand how to progress herself and importance of contiuation    Baseline  still learning    Time  12    Period  Weeks    Status  On-going      PT LONG TERM GOAL #2   Title  urinary leakage with coughing, sneezing, and lifting decreased >/= 80% due to improved pelvic floor strength  >/= 4/5    Baseline  improved by 50%    Time  12    Period  Weeks    Status  On-going      PT LONG TERM GOAL #3   Title  urinary leakage with fast walk decreased >/= 80% due to correct breathing pattern and increased pelvic floor strength     Baseline  improved by 50%    Time  12    Period  Weeks    Status  On-going      PT LONG TERM GOAL #4   Title  sit with coccyx pain decreased >/= 75% due to improved elongation of muscles    Baseline  less time with coccyx pain at level 5/10    Time  12    Period  Weeks    Status  On-going      PT LONG TERM GOAL #5   Title  ability to fully empty her bladder and bowels due to ability to fully relax the pelvic floor muscles and contract the lower abdominals    Time  12    Period  Weeks    Status  Achieved            Plan - 01/31/18 1106    Clinical Impression Statement  Patient has increased bilateral hip strength. Patient urinary  leakage has decreased by 50%. Patient is more aware of her posture and ability to perform abdominal bracing. Patient understands how to go from sit to stand and back with pelvic floor contraction and abdominal bracing. Patient needed tactile cues to perfomr abdominal bracing in supine with rib cage depression. Patient will benefit from skilled therapy to improve pelvic floor strength and coordination to reduce urinary leakage and improve bowel health.     Rehab Potential  Excellent    Clinical Impairments Affecting Rehab Potential  right anterior THR 03/08/2017; Sigmoid cancer 10/2016    PT Frequency  1x / week    PT Duration  12 weeks    PT Treatment/Interventions  Biofeedback;Electrical Stimulation;Cryotherapy;Moist Heat;Ultrasound;Therapeutic activities;Therapeutic exercise;Manual techniques;Dry needling;Patient/family education;Neuromuscular re-education    PT Next Visit Plan  pelvic floor EMG with tranistional movement and coordination; assess coccyx pain; check pelvic floor strength    PT Home Exercise Plan  Access Code: [email or web address]     Consulted and Agree with Plan of Care  Patient       Patient will benefit from skilled therapeutic intervention in order to improve the following deficits and impairments:  Pain, Increased muscle spasms, Impaired flexibility, Decreased activity tolerance, Decreased endurance, Decreased strength, Decreased coordination  Visit Diagnosis: Muscle weakness (generalized)  Other lack of coordination  Coccyx pain     Problem List Patient Active Problem List   Diagnosis Date Noted  . Status post total replacement of right hip 03/08/2017  . Cancer of sigmoid colon ([place]) 11/09/2016  . Pain of right hip joint 09/19/2016  . Unilateral primary osteoarthritis, right hip 09/19/2016    Earlie Counts, PT 01/31/18 11:44 AM   [facility] [address], [place], [place], [address] Phone: [phone]    Fax:  [phone]  Name: Stacy [name] MRN: [id number] Date of Birth: Jun 12, 1965

## 2018-02-13 ENCOUNTER — Encounter: Payer: Self-pay | Admitting: Physical Therapy

## 2018-02-13 ENCOUNTER — Ambulatory Visit: Payer: BLUE CROSS/BLUE SHIELD | Attending: General Surgery | Admitting: Physical Therapy

## 2018-02-13 DIAGNOSIS — R278 Other lack of coordination: Secondary | ICD-10-CM | POA: Diagnosis present

## 2018-02-13 DIAGNOSIS — M533 Sacrococcygeal disorders, not elsewhere classified: Secondary | ICD-10-CM

## 2018-02-13 DIAGNOSIS — M6281 Muscle weakness (generalized): Secondary | ICD-10-CM | POA: Insufficient documentation

## 2018-02-13 NOTE — Therapy (Signed)
[facility] [address], [place], [place], [address] Phone: [phone]   Fax:  [phone]  Physical Therapy Treatment  Patient Details  Name: Stacy [name] MRN: [id number] Date of Birth: Nov 05, 1965 Referring Provider (PT): Dr. Stark Klein   Encounter Date: 02/13/2018  PT End of Session - 02/13/18 0949    Visit Number  9    Date for PT Re-Evaluation  02/21/18    Authorization Type  BCBS    PT Start Time  0930    PT Stop Time  1010    PT Time Calculation (min)  40 min    Activity Tolerance  Patient tolerated treatment well    Behavior During Therapy  [facility] for tasks assessed/performed       Past Medical History:  Diagnosis Date  . Arthritis    right hip  . Cancer [facility])    sigmoid colon cancer  . Family history of adverse reaction to anesthesia    father:confusion,disorientation  . GERD (gastroesophageal reflux disease)   . Hypertension     Past Surgical History:  Procedure Laterality Date  . adnoidectomy     as child  . LAPAROSCOPIC SIGMOID COLECTOMY N/A 11/09/2016   Procedure: LAPAROSCOPIC SIGMOID COLECTOMY;  Surgeon: Stark Klein, MD;  Location: [place];  Service: General;  Laterality: N/A;  . MULTIPLE TOOTH EXTRACTIONS     as a child  . MYRINGOTOMY     as a child  . PARTIAL COLECTOMY  11/09/2016   laparoscopic partial colectomy for sigmoid colon cancer  . SIGMOIDOSCOPY  03/01/2017   post-op eval   . TOOTH EXTRACTION    . TOTAL HIP ARTHROPLASTY Right 03/08/2017   Procedure: RIGHT TOTAL HIP ARTHROPLASTY ANTERIOR APPROACH;  Surgeon: Mcarthur Rossetti, MD;  Location: [facility];  Service: Orthopedics;  Laterality: Right;    There were no vitals filed for this visit.  Subjective Assessment - 02/13/18 0934    Subjective  I still have some coccyx pain but my chair cushion helps alot. My leakage has improved by 75%. It is alot less frequent and random.     Patient Stated Goals  reduce urinary  leakage    Currently in Pain?  Yes    Pain Score  2     Pain Location  Coccyx    Pain Orientation  Mid    Pain Descriptors / Indicators  Aching    Pain Type  Acute pain    Pain Onset  More than a month ago    Pain Frequency  Intermittent    Aggravating Factors   sit to stand, sitting to play an instrument on hard chair, sitting on a couch    Pain Relieving Factors  change positions, sit on a different hip, stand up and move around    Multiple Pain Sites  No                       [facility] Adult PT Treatment/Exercise - 02/13/18 0001      Therapeutic Activites    Therapeutic Activities  Other Therapeutic Activities    Other Therapeutic Activities  changing posture while playing her clarinet for several hours and tying to stand while playing the instrument      Exercises   Exercises  Lumbar      Lumbar Exercises: Aerobic   Recumbent Bike  level 3 for 6 minutes      Lumbar Exercises: Supine   Bent Knee Raise  20 reps;1 second   on foam roll; pelvic floor contraction   Dead Bug  20 reps;1 second   while on the foam roll; pelvic floor contraction   Other Supine Lumbar Exercises  on foam roll- pelvic floor contraction alternate shoulder flexion,       Shoulder Exercises: Supine   Horizontal ABduction  Strengthening;Both;20 reps;Theraband   on foam roll; pelvic floor contraction   Theraband Level (Shoulder Horizontal ABduction)  Level 2 (Red)    Flexion  Both;20 reps;Theraband   on foam roll; pelvic floor contractio; V motion   Theraband Level (Shoulder Flexion)  Level 2 (Red)    Diagonals  Strengthening;Right;Left;20 reps;Theraband   on foam roll; pelvic floor contraction   Theraband Level (Shoulder Diagonals)  Level 2 (Red)             PT Education - 02/13/18 1012    Education provided  Yes    Education Details  Access Code: [email or web address]     Person(s) Educated  Patient    Methods  Explanation;Demonstration;Verbal cues;Handout    Comprehension  Returned  demonstration;Verbalized understanding       PT Short Term Goals - 12/27/17 1109      PT SHORT TERM GOAL #1   Title  independent with initial HEP    Time  4    Period  Weeks    Status  Achieved      PT SHORT TERM GOAL #2   Title  understand abdominal massage to improve bowel movements and abdominal tissue mobility    Time  4    Period  Weeks    Status  Achieved      PT SHORT TERM GOAL #3   Title  understand correct toileting technique for urination and bowel movements to fully empty bladder and rectum    Time  4    Period  Weeks    Status  Achieved        PT Long Term Goals - 02/13/18 1015      PT LONG TERM GOAL #1   Title  independent with HEP and understand how to progress herself and importance of contiuation    Baseline  still learning    Time  12    Period  Weeks    Status  On-going      PT LONG TERM GOAL #2   Title  urinary leakage with coughing, sneezing, and lifting decreased >/= 80% due to improved pelvic floor strength  >/= 4/5    Baseline  improved by 75%    Time  12    Period  Weeks    Status  On-going      PT LONG TERM GOAL #3   Title  urinary leakage with fast walk decreased >/= 80% due to correct breathing pattern and increased pelvic floor strength     Baseline  improved by 75%    Time  12    Period  Weeks    Status  On-going      PT LONG TERM GOAL #4   Title  sit with coccyx pain decreased >/= 75% due to improved elongation of muscles    Baseline  less time with coccyx pain at level 2/10    Time  12    Period  Weeks    Status  On-going      PT LONG TERM GOAL #5   Title  ability to fully empty her bladder and bowels due to ability to fully relax the pelvic  floor muscles and contract the lower abdominals    Time  12    Period  Weeks    Status  Achieved            Plan - 02/13/18 0950    Clinical Impression Statement  After treatment no coccyx pain and able to sit upright without difficulty. At the end of treatment patient was able  to tighten the right abdominals correctly. Patient urinary leakage has decreased by 75% since initial evaluation. Patient still has coccyx pain but is more a 2/10 instead of 5/10. Patient instructed to stand at times when playig the smaller recorder. Patient has done exercises on foam roll to open up her  trunk and improve mobilty . Patient will benefit from skilled therapy to improve pelvic floor strength and coordination to reduce urinary leakage and improve bowel health.     Rehab Potential  Excellent    Clinical Impairments Affecting Rehab Potential  right anterior THR 03/08/2017; Sigmoid cancer 10/2016    PT Frequency  1x / week    PT Duration  12 weeks    PT Treatment/Interventions  Biofeedback;Electrical Stimulation;Cryotherapy;Moist Heat;Ultrasound;Therapeutic activities;Therapeutic exercise;Manual techniques;Dry needling;Patient/family education;Neuromuscular re-education    PT Next Visit Plan  write renewal; continue to work on pelvic floor strength and postural strength    PT Home Exercise Plan  Access Code: [email or web address]     Consulted and Agree with Plan of Care  Patient       Patient will benefit from skilled therapeutic intervention in order to improve the following deficits and impairments:  Pain, Increased muscle spasms, Impaired flexibility, Decreased activity tolerance, Decreased endurance, Decreased strength, Decreased coordination  Visit Diagnosis: Muscle weakness (generalized)  Other lack of coordination  Coccyx pain     Problem List Patient Active Problem List   Diagnosis Date Noted  . Status post total replacement of right hip 03/08/2017  . Cancer of sigmoid colon ([place]) 11/09/2016  . Pain of right hip joint 09/19/2016  . Unilateral primary osteoarthritis, right hip 09/19/2016    Earlie Counts, PT 02/13/18 10:17 AM   [facility] [address] [place], [place], [address] Phone: [phone]   Fax:   [phone]  Name: Stacy [name] MRN: [id number] Date of Birth: 11/12/1965

## 2018-02-13 NOTE — Patient Instructions (Signed)
Access Code: [email or web address]  URL: [email or web address]  Date: 02/13/2018  Prepared by: Earlie Counts   Exercises  Seated Hamstring Stretch - 2 reps - 1 sets - 30 sec hold - 1x daily - 7x weekly  Seated Piriformis Stretch with Trunk Bend - 2 reps - 1 sets - 30 sec hold - 1x daily - 7x weekly  Supine Lower Trunk Rotation - 2 reps - 1 sets - 30 sec hold - 1x daily - 7x weekly  Supine Butterfly Groin Stretch - 1 reps - 1 sets - 1 min hold - 1x daily - 7x weekly  Cat Cow - 10 reps - 1 sets - 1x daily - 7x weekly  Supine Pelvic Tilt - 10 reps - 1 sets - 1x daily - 7x weekly  Supine March on Foam Roll - 10 reps - 1 sets - 1x daily - 7x weekly  Thoracic Y on Foam Roll - 10 reps - 2 sets - 1x daily - 7x weekly  Dead Bug on Foam Roll - 10 reps - 1 sets - 1x daily - 7x weekly  Supine Static Chest Stretch on Foam Roll - 10 reps - 1 sets - 1 sec hold - 1x daily - 7x weekly  Thoracic Extension Mobilization on Foam Roll - 10 reps - 1 sets - 1x daily - 7x weekly  Standing Shoulder Diagonal Horizontal Abduction 60/120 Degrees with Resistance - 10 reps - 2 sets - 1x daily - 7x weekly  [facility] [address], [place], [place] [address] Phone # [phone] Fax [phone]

## 2018-02-21 ENCOUNTER — Encounter: Payer: Self-pay | Admitting: Physical Therapy

## 2018-02-21 ENCOUNTER — Ambulatory Visit: Payer: BLUE CROSS/BLUE SHIELD | Admitting: Physical Therapy

## 2018-02-21 DIAGNOSIS — M6281 Muscle weakness (generalized): Secondary | ICD-10-CM

## 2018-02-21 DIAGNOSIS — R278 Other lack of coordination: Secondary | ICD-10-CM

## 2018-02-21 DIAGNOSIS — M533 Sacrococcygeal disorders, not elsewhere classified: Secondary | ICD-10-CM

## 2018-02-21 NOTE — Patient Instructions (Signed)
Moisturizers . They are used in the vagina to hydrate the mucous membrane that make up the vaginal canal. . Designed to keep a more normal acid balance (ph) . Once placed in the vagina, it will last between two to three days.  . Use 2-3 times per week at bedtime and last longer than 60 min. . Ingredients to avoid is glycerin and fragrance, can increase chance of infection . Should not be used just before sex due to causing irritation . Most are gels administered either in a tampon-shaped applicator or as a vaginal suppository. They are non-hormonal.   Types of Moisturizers . Samul Dada- drug store . Vitamin E vaginal suppositories- Whole foods, Amazon . Moist Again . Coconut oil- can break down condoms . Julva- (Do no use if on Tamoxifen) amazon . Yes moisturizer- amazon . NeuEve Silk , NeuEve Silver for menopausal or over 65 (if have severe vaginal atrophy or cancer treatments use NeuEve Silk for  1 month than move to The Pepsi)- [employer], MapleFlower.dk . Olive and Bee intimate cream- www.oliveandbee.com.au . Mae vaginal moisturizer- Amazon  Creams to use externally on the Vulva area  Albertson's (good for for cancer patients that had radiation to the area)- [place] or [employer].FlyingBasics.com.br  V-magic cream - amazon  Julva-amazon  Vital "V Wild Yam salve ( help moisturize and help with thinning vulvar area, does have Blanchard by Damiva labial moisturizer ([place],    Things to avoid in the vaginal area . Do not use things to irritate the vulvar area . No lotions just specialized creams for the vulva area- Neogyn, V-magic, No soaps; can use Aveeno or Calendula cleanser if needed. Must be gentle . No deodorants . No douches . Good to sleep without underwear to let the vaginal area to air out . No scrubbing: spread the lips to let warm water rinse over labias and pat dry  Lubrication . Used  for intercourse to reduce friction . Avoid ones that have glycerin, warming gels, tingling gels, icing or cooling gel, scented . Avoid parabens due to a preservative similar to female sex hormone . May need to be reapplied once or several times during sexual activity . Can be applied to both partners genitals prior to vaginal penetration to minimize friction or irritation . Prevent irritation and mucosal tears that cause post coital pain and increased the risk of vaginal and urinary tract infections . Oil-based lubricants cannot be used with condoms due to breaking them down.  Least likely to irritate vaginal tissue.  . Plant based-lubes are safe . Silicone-based lubrication are thicker and last long and used for post-menopausal women  Vaginal Lubricators Here is a list of some suggested lubricators you can use for intercourse. Use the most hypoallergenic product.  You can place on you or your partner.   Slippery Stuff  Sylk or Sliquid Natural H2O ( good  if frequent UTI's)  Blossom Organics (www.blossom-organics.com)  Luvena   Coconut oil  PJur Woman Nude- water based lubricant, amazon  Uberlube- Amazon  Aloe Vera  Yes lubricant- [employer] Platinum-Silicone, Target, Walgreens  Olive and Bee intimate cream-  www.oliveandbee.com.au Things to avoid in lubricants are glycerin, warming gels, tingling gels, icing or cooling  gels, and scented gels.  Also avoid Vaseline. KY jelly, Replens, and Astroglide kills good bacteria(lactobacilli)  Things to avoid in the vaginal area . Do not use things to irritate the vulvar  area . No lotions- see below . No soaps; can use Aveeno or Calendula cleanser if needed. Must be gentle . No deodorants . No douches . Good to sleep without underwear to let the vaginal area to air out . No scrubbing: spread the lips to let warm water rinse over labias and pat dry  Creams that can be used on the [place] Releveum or [facility] [address] [place], [place] [address] Phone # [phone] Fax [phone]

## 2018-02-21 NOTE — Therapy (Signed)
[facility] [address] [place], [place], [address] Phone: [phone]   Fax:  [phone]  Physical Therapy Treatment  Patient Details  Name: Stacy [name] MRN: [id number] Date of Birth: 1965-06-27 Referring Provider (Stacy [name]): Dr. Stark Klein   Encounter Date: 02/21/2018  Stacy [name] End of Session - 02/21/18 1108    Visit Number  10    Date for Stacy [name] Re-Evaluation  03/07/18    Authorization Type  BCBS    Stacy [name] Start Time  1100    Stacy [name] Stop Time  1140    Stacy [name] Time Calculation (min)  40 min    Activity Tolerance  Patient tolerated treatment well    Behavior During Therapy  [facility] for tasks assessed/performed       Past Medical History:  Diagnosis Date  . Arthritis    right hip  . Cancer [facility])    sigmoid colon cancer  . Family history of adverse reaction to anesthesia    father:confusion,disorientation  . GERD (gastroesophageal reflux disease)   . Hypertension     Past Surgical History:  Procedure Laterality Date  . adnoidectomy     as child  . LAPAROSCOPIC SIGMOID COLECTOMY N/A 11/09/2016   Procedure: LAPAROSCOPIC SIGMOID COLECTOMY;  Surgeon: Stark Klein, MD;  Location: [place];  Service: General;  Laterality: N/A;  . MULTIPLE TOOTH EXTRACTIONS     as a child  . MYRINGOTOMY     as a child  . PARTIAL COLECTOMY  11/09/2016   laparoscopic partial colectomy for sigmoid colon cancer  . SIGMOIDOSCOPY  03/01/2017   post-op eval   . TOOTH EXTRACTION    . TOTAL HIP ARTHROPLASTY Right 03/08/2017   Procedure: RIGHT TOTAL HIP ARTHROPLASTY ANTERIOR APPROACH;  Surgeon: Mcarthur Rossetti, MD;  [facility];  Service: Orthopedics;  Laterality: Right;    There were no vitals filed for this visit.  Subjective Assessment - 02/21/18 1106    Subjective  I am feeling good. Urinary leakage is 75% better.     Patient Stated Goals  reduce urinary leakage    Currently in Pain?  No/denies         [facility] Stacy [name] Assessment -  02/21/18 0001      Assessment   Medical Diagnosis  N39.3 Stress Incontinence    Referring Provider (Stacy [name])  Dr. Stark Klein    Onset Date/Surgical Date  04/13/17    Prior Therapy  yes for the total hip      Precautions   Precautions  Other (comment)    Precaution Comments  cancer      Restrictions   Weight Bearing Restrictions  No      Balance Screen   Has the patient fallen in the past 6 months  No    Has the patient had a decrease in activity level because of a fear of falling?   No    Is the patient reluctant to leave their home because of a fear of falling?   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    Leisure  would like to get back into exercise; play recorder      Cognition   Overall Cognitive Status  Within Functional Limits for tasks assessed  Posture/Postural Control   Posture/Postural Control  Postural limitations    Postural Limitations  Rounded Shoulders;Forward head;Flexed trunk      ROM / Strength   AROM / PROM / Strength  AROM;PROM;Strength      Strength   Right Hip ABduction  5/5    Left Hip ABduction  5/5                Pelvic Floor Special Questions - 02/21/18 0001    Pelvic Floor Internal Exam  Patient confirms identification and approves Stacy [name] to aseess muscles and treatment    Exam Type  Vaginal    Strength  good squeeze, good lift, able to hold agaisnt strong resistance        [facility] Adult Stacy [name] Treatment/Exercise - 02/21/18 0001      Self-Care   Self-Care  Other Self-Care Comments    Other Self-Care Comments   information on vaginal moisturizers and lubricants      Lumbar Exercises: Stretches   Pelvic Tilt  15 reps   improve lumbar mobility     Lumbar Exercises: Aerobic   Recumbent Bike  level 4 for 10 minutes      Lumbar Exercises: Supine    Bent Knee Raise  20 reps;1 second   on foam roll; pelvic floor contraction   Dead Bug  20 reps;1 second   while on the foam roll; pelvic floor contraction   Other Supine Lumbar Exercises  on foam roll- pelvic floor contraction alternate shoulder flexion,       Shoulder Exercises: Supine   Horizontal ABduction  Strengthening;Both;20 reps;Theraband   on foam roll; pelvic floor contraction   Theraband Level (Shoulder Horizontal ABduction)  Level 2 (Red)    Flexion  Both;20 reps;Theraband   on foam roll; pelvic floor contractio; V motion   Theraband Level (Shoulder Flexion)  Level 2 (Red)    Diagonals  Strengthening;Right;Left;20 reps;Theraband   on foam roll; pelvic floor contraction   Theraband Level (Shoulder Diagonals)  Level 2 (Red)      Manual Therapy   Manual Therapy  Internal Pelvic Floor    Internal Pelvic Floor  bil. obturator internist and introitus             Stacy [name] Education - 02/21/18 1140    Education provided  Yes    Education Details  information on vaginal moisturizers and lubricants    Person(s) Educated  Patient    Methods  Explanation;Handout    Comprehension  Verbalized understanding       Stacy [name] Short Term Goals - 12/27/17 1109      Stacy [name] SHORT TERM GOAL #1   Title  independent with initial HEP    Time  4    Period  Weeks    Status  Achieved      Stacy [name] SHORT TERM GOAL #2   Title  understand abdominal massage to improve bowel movements and abdominal tissue mobility    Time  4    Period  Weeks    Status  Achieved      Stacy [name] SHORT TERM GOAL #3   Title  understand correct toileting technique for urination and bowel movements to fully empty bladder and rectum    Time  4    Period  Weeks    Status  Achieved        Stacy [name] Long Term Goals - 02/21/18 1145      Stacy [name] LONG TERM GOAL #1   Title  independent with HEP and understand  how to progress herself and importance of contiuation    Baseline  still learning    Time  12    Period  Weeks    Status  On-going       Stacy [name] LONG TERM GOAL #2   Title  urinary leakage with coughing, sneezing, and lifting decreased >/= 80% due to improved pelvic floor strength  >/= 4/5    Baseline  improved by 75%    Time  12    Period  Weeks    Status  On-going      Stacy [name] LONG TERM GOAL #3   Title  urinary leakage with fast walk decreased >/= 80% due to correct breathing pattern and increased pelvic floor strength     Baseline  improved by 75%    Time  12    Period  Weeks    Status  On-going      Stacy [name] LONG TERM GOAL #4   Title  sit with coccyx pain decreased >/= 75% due to improved elongation of muscles    Baseline  less time with coccyx pain at level 2/10    Time  12    Period  Weeks    Status  On-going      Stacy [name] LONG TERM GOAL #5   Title  ability to fully empty her bladder and bowels due to ability to fully relax the pelvic floor muscles and contract the lower abdominals    Time  12    Period  Weeks    Status  Achieved            Plan - 02/21/18 1112    Clinical Impression Statement  Patient has increased pelvic floor strength to 4/5 for 6 seconds. Patient reports her urinary leakage is 75% better. Patient was educated on using vaginal moisturizers and lubricants. Patient has improved posture after laying supine on the foam roll. Patient coccyx pain has improved. Patient will benefit from skilled therapy to improve pelvic floor strength and coordination to reduce urinary leakage and improve bowel health.     Rehab Potential  Excellent    Clinical Impairments Affecting Rehab Potential  right anterior THR 03/08/2017; Sigmoid cancer 10/2016    Stacy [name] Frequency  1x / week    Stacy [name] Duration  2 weeks    Stacy [name] Treatment/Interventions  Biofeedback;Electrical Stimulation;Cryotherapy;Moist Heat;Ultrasound;Therapeutic activities;Therapeutic exercise;Manual techniques;Dry needling;Patient/family education;Neuromuscular re-education    Stacy [name] Next Visit Plan  reveiw HEP and see patient next week for discharge    Stacy [name] Home Exercise Plan   Access Code: [email or web address]     Recommended Other Services  sent MD renewal 02/21/2018    Consulted and Agree with Plan of Care  Patient       Patient will benefit from skilled therapeutic intervention in order to improve the following deficits and impairments:  Pain, Increased muscle spasms, Impaired flexibility, Decreased activity tolerance, Decreased endurance, Decreased strength, Decreased coordination  Visit Diagnosis: Muscle weakness (generalized) - Plan: Stacy [name] plan of care cert/re-cert  Other lack of coordination - Plan: Stacy [name] plan of care cert/re-cert  Coccyx pain - Plan: Stacy [name] plan of care cert/re-cert     Problem List Patient Active Problem List   Diagnosis Date Noted  . Status post total replacement of right hip 03/08/2017  . Cancer of sigmoid colon ([place]) 11/09/2016  . Pain of right hip joint 09/19/2016  . Unilateral primary osteoarthritis, right hip 09/19/2016    Stacy [name], Stacy [name] 02/21/18 11:47 AM   [facility] [address], [place], [place], [address] Phone: [phone]   Fax:  [phone]  Name: Stacy [name] MRN: [id number] Date of Birth: December 20, 1965

## 2018-02-28 ENCOUNTER — Encounter: Payer: Self-pay | Admitting: Physical Therapy

## 2018-02-28 ENCOUNTER — Ambulatory Visit: Payer: BLUE CROSS/BLUE SHIELD | Admitting: Physical Therapy

## 2018-02-28 DIAGNOSIS — R278 Other lack of coordination: Secondary | ICD-10-CM

## 2018-02-28 DIAGNOSIS — M6281 Muscle weakness (generalized): Secondary | ICD-10-CM

## 2018-02-28 NOTE — Patient Instructions (Signed)
Access Code: [email or web address]  URL: [email or web address]  Date: 02/13/2018  Prepared by: Earlie Counts   Exercises  Seated Hamstring Stretch - 2 reps - 1 sets - 30 sec hold - 1x daily - 7x weekly  Seated Piriformis Stretch with Trunk Bend - 2 reps - 1 sets - 30 sec hold - 1x daily - 7x weekly  Supine Lower Trunk Rotation - 2 reps - 1 sets - 30 sec hold - 1x daily - 7x weekly  Supine Butterfly Groin Stretch - 1 reps - 1 sets - 1 min hold - 1x daily - 7x weekly  Cat Cow - 10 reps - 1 sets - 1x daily - 7x weekly  Supine Pelvic Tilt - 10 reps - 1 sets - 1x daily - 7x weekly  Supine March on Foam Roll - 10 reps - 1 sets - 1x daily - 7x weekly  Thoracic Y on Foam Roll - 10 reps - 2 sets - 1x daily - 7x weekly  Dead Bug on Foam Roll - 10 reps - 1 sets - 1x daily - 7x weekly  Supine Static Chest Stretch on Foam Roll - 10 reps - 1 sets - 1 sec hold - 1x daily - 7x weekly  Thoracic Extension Mobilization on Foam Roll - 10 reps - 1 sets - 1x daily - 7x weekly  Standing Shoulder Diagonal Horizontal Abduction 60/120 Degrees with Resistance - 10 reps - 2 sets - 1x daily - 7x weekly  Cough: Phase 4 (Sitting)    Squeeze pelvic floor and hold. Inhale. Lean shoulders forward. Cough repeatedly. Relax. Repeat _5__ times. Do _1__ times a day.  Copyright  VHI. All rights reserved.  Cough: Phase 4 (Standing)    Squeeze pelvic floor and hold. Inhale. Lean trunk  forward. Cough repeatedly. Relax. Repeat _5__ times. Do ___ times a day.  Copyright  VHI. All rights reserved.  Slow Contraction: Gravity Resisted (Sitting)    Sitting, slowly squeeze pelvic floor for _20__ seconds. Rest for _10__ seconds. Repeat __3_ times. Do _1__ times a day.  Copyright  VHI. All rights reserved.  Slow Contraction: Gravity Resisted (Sitting)    Sitting, slowly squeeze pelvic floor for _20__ seconds. Rest for __10_ seconds. Repeat __3_ times. Do __1_ times a day.  Copyright  VHI. All rights reserved.    [place] [address] [place], [place] [address] Phone # [phone] Fax [phone]

## 2018-02-28 NOTE — Therapy (Addendum)
[facility] [address] [place], [place], [address] Phone: [phone]   Fax:  [phone]  Physical Therapy Treatment  Patient Details  Name: Stacy [name] MRN: [id number] Date of Birth: [birth year] Referring Provider (PT): Dr. Stark Klein   Encounter Date: 02/28/2018  PT End of Session - 02/28/18 1110    Visit Number  11    Date for PT Re-Evaluation  03/07/18    Authorization Type  BCBS    PT Start Time  1100    PT Stop Time  1140    PT Time Calculation (min)  40 min    Activity Tolerance  Patient tolerated treatment well    Behavior During Therapy  [facility] for tasks assessed/performed       Past Medical History:  Diagnosis Date  . Arthritis    right hip  . Cancer [facility])    sigmoid colon cancer  . Family history of adverse reaction to anesthesia    father:confusion,disorientation  . GERD (gastroesophageal reflux disease)   . Hypertension     Past Surgical History:  Procedure Laterality Date  . adnoidectomy     as child  . LAPAROSCOPIC SIGMOID COLECTOMY N/A 11/09/2016   Procedure: LAPAROSCOPIC SIGMOID COLECTOMY;  Surgeon: Stark Klein, MD;  Location: [place];  Service: General;  Laterality: N/A;  . MULTIPLE TOOTH EXTRACTIONS     as a child  . MYRINGOTOMY     as a child  . PARTIAL COLECTOMY  11/09/2016   laparoscopic partial colectomy for sigmoid colon cancer  . SIGMOIDOSCOPY  03/01/2017   post-op eval   . TOOTH EXTRACTION    . TOTAL HIP ARTHROPLASTY Right 03/08/2017   Procedure: RIGHT TOTAL HIP ARTHROPLASTY ANTERIOR APPROACH;  Surgeon: Mcarthur Rossetti, MD;  Location[facility];  Service: Orthopedics;  Laterality: Right;    There were no vitals filed for this visit.  Subjective Assessment - 02/28/18 1105    Subjective  I only leak when I get csought by a surprise sneeze. I got my foam roll.     Patient Stated Goals  reduce urinary leakage    Currently in Pain?  No/denies          [facility] PT Assessment - 02/28/18 0001      Assessment   Medical Diagnosis  N39.3 Stress Incontinence    Referring Provider (PT)  Dr. 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    Leisure  would like to get back into exercise; play recorder      Cognition   Overall Cognitive Status  Within Functional Limits for tasks assessed      Posture/Postural Control   Posture/Postural Control  Postural limitations    Postural Limitations  Rounded Shoulders;Forward head;Flexed trunk      Strength   Right Hip Extension  5/5    Right Hip ABduction  5/5    Left Hip Extension  5/5  Left Hip ABduction  5/5                Pelvic Floor Special Questions - 02/28/18 0001    Activities that cause leaking  Coughing   when not expecting it   Strength  good squeeze, good lift, able to hold agaisnt strong resistance        [facility] Adult PT Treatment/Exercise - 02/28/18 0001      Therapeutic Activites    Therapeutic Activities  ADL's    Other Therapeutic Activities  sitting posture when playing recorder with towel anterior to sit bones      Lumbar Exercises: Stretches   Active Hamstring Stretch  Right;Left;1 rep;30 seconds   sitting   Lower Trunk Rotation  2 reps;30 seconds   supine; both sides   Piriformis Stretch  Right;Left;1 rep;30 seconds   sitting     Lumbar Exercises: Aerobic   Recumbent Bike  level 4 for 10 minutes      Lumbar Exercises: Supine   Bent Knee Raise  20 reps;1 second   on foam roll; pelvic floor contraction   Dead Bug  20 reps;1 second   while  on the foam roll; pelvic floor contraction   Other Supine Lumbar Exercises  on foam roll- pelvic floor contraction alternate shoulder flexion,       Lumbar Exercises: Quadruped   [place] Horse  15 reps      Shoulder Exercises: Supine   Horizontal ABduction  Strengthening;Both;20 reps;Theraband   on foam roll; pelvic floor contraction   Theraband Level (Shoulder Horizontal ABduction)  Level 3 (Green)    Flexion  Both;20 reps;Theraband   on foam roll; pelvic floor contractio; V motion   Theraband Level (Shoulder Flexion)  Level 3 (Green)    Diagonals  Strengthening;Right;Left;20 reps;Theraband   on foam roll; pelvic floor contraction   Theraband Level (Shoulder Diagonals)  Level 3 [facility] Cowden)             PT Education - 02/28/18 1145    Education provided  Yes    Education Details  Access Code: [email or web address]    Person(s) Educated  Patient    Methods  Explanation;Demonstration;Verbal cues;Handout    Comprehension  Returned demonstration;Verbalized understanding       PT Short Term Goals - 12/27/17 1109      PT SHORT TERM GOAL #1   Title  independent with initial HEP    Time  4    Period  Weeks    Status  Achieved      PT SHORT TERM GOAL #2   Title  understand abdominal massage to improve bowel movements and abdominal tissue mobility    Time  4    Period  Weeks    Status  Achieved      PT SHORT TERM GOAL #3   Title  understand correct toileting technique for urination and bowel movements to fully empty bladder and rectum    Time  4    Period  Weeks    Status  Achieved        PT Long Term Goals - 02/28/18 1110      PT LONG TERM GOAL #1   Title  independent with HEP and understand how to progress herself and importance of contiuation    Time  12    Period  Weeks    Status  Achieved      PT LONG TERM GOAL #2   Title  urinary leakage with coughing, sneezing, and lifting  decreased >/= 80% due to improved pelvic floor strength  >/= 4/5    Time  12    Period  Weeks     Status  Achieved      PT LONG TERM GOAL #3   Title  urinary leakage with fast walk decreased >/= 80% due to correct breathing pattern and increased pelvic floor strength     Time  12    Period  Weeks    Status  Achieved      PT LONG TERM GOAL #4   Title  sit with coccyx pain decreased >/= 75% due to improved elongation of muscles    Baseline  60% improvement    Time  12    Period  Weeks    Status  Partially Met      PT LONG TERM GOAL #5   Title  ability to fully empty her bladder and bowels due to ability to fully relax the pelvic floor muscles and contract the lower abdominals    Time  12    Period  Weeks    Status  Achieved            Plan - 02/28/18 1115    Clinical Impression Statement  Patient has met her goals with exception of coccyx pain. Coccyx pain has improved by 60%. Patient understands how to sit correctly in a perched posture. Patient has full strength of hips and pelvic floor strength is 4/5. Patient reports urinary leakage only happens when she coughs unexpectantly. Patient is independent with her HEP. Patientis ready for discharge.     Rehab Potential  Excellent    Clinical Impairments Affecting Rehab Potential  right anterior THR 03/08/2017; Sigmoid cancer 10/2016    PT Treatment/Interventions  Biofeedback;Electrical Stimulation;Cryotherapy;Moist Heat;Ultrasound;Therapeutic activities;Therapeutic exercise;Manual techniques;Dry needling;Patient/family education;Neuromuscular re-education    PT Next Visit Plan  Discharge to HEP this visit    PT Home Exercise Plan  Access Code: [email or web address]     Recommended Other Services  MD signed initial and renewal note    Consulted and Agree with Plan of Care  Patient       Patient will benefit from skilled therapeutic intervention in order to improve the following deficits and impairments:  Pain, Increased muscle spasms, Impaired flexibility, Decreased activity tolerance, Decreased endurance, Decreased strength, Decreased  coordination  Visit Diagnosis: Muscle weakness (generalized)  Other lack of coordination     Problem List Patient Active Problem List   Diagnosis Date Noted  . Status post total replacement of right hip 03/08/2017  . Cancer of sigmoid colon ([place]) 11/09/2016  . Pain of right hip joint 09/19/2016  . Unilateral primary osteoarthritis, right hip 09/19/2016    Earlie Counts, PT 02/28/18 11:46 AM   [facility] [address] [place], [place], [address] Phone: [phone]   Fax:  [phone]  Name: Jasma Seevers MRN: [id number] Date of Birth: 1965-09-18  PHYSICAL THERAPY DISCHARGE SUMMARY  Visits from Start of Care: 11 Current functional level related to goals / functional outcomes: See above.    Remaining deficits: See above.    Education / Equipment: HEP Plan: Patient agrees to discharge.  Patient goals were met. Patient is being discharged due to meeting the stated rehab goals.  Thank you for the referral. Earlie Counts, PT 02/28/18 11:20 AM  ?????

## 2018-07-14 IMAGING — MG 2D DIGITAL SCREENING BILATERAL MAMMOGRAM WITH CAD AND ADJUNCT TO
9 of 12 series · 9 of 28 positions shown · non-contrast
Comparison: None

CLINICAL DATA: Screening.

EXAM:
2D DIGITAL SCREENING BILATERAL MAMMOGRAM WITH CAD AND ADJUNCT TOMO

[L MLO synth-2D]
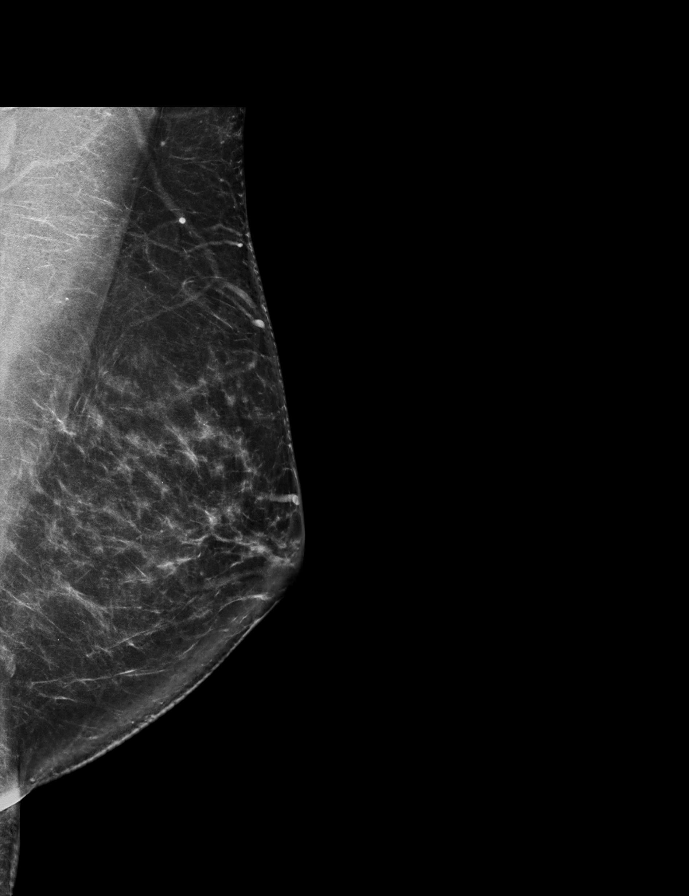

[L MLO]
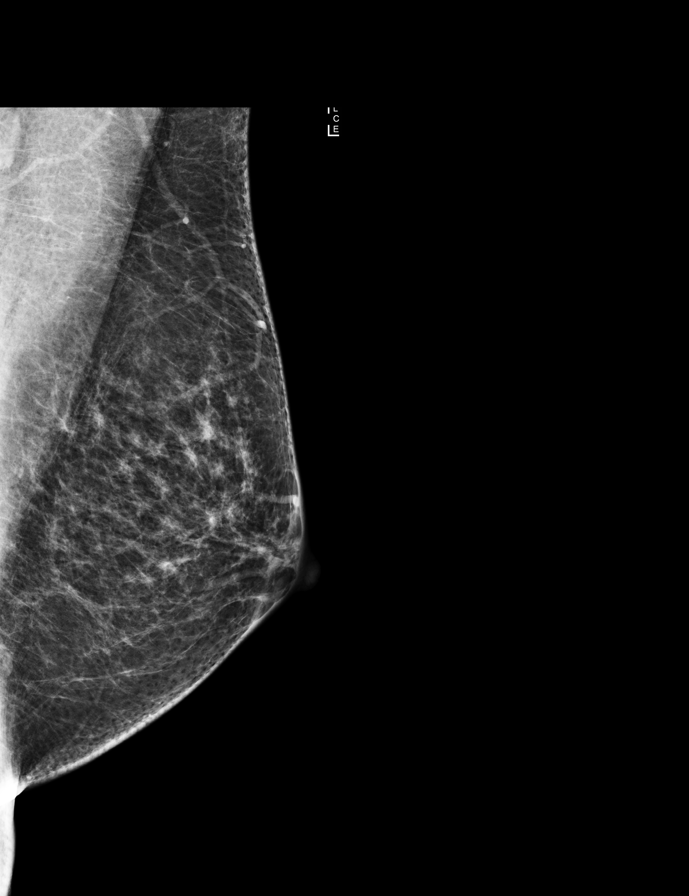

[L CC synth-2D]
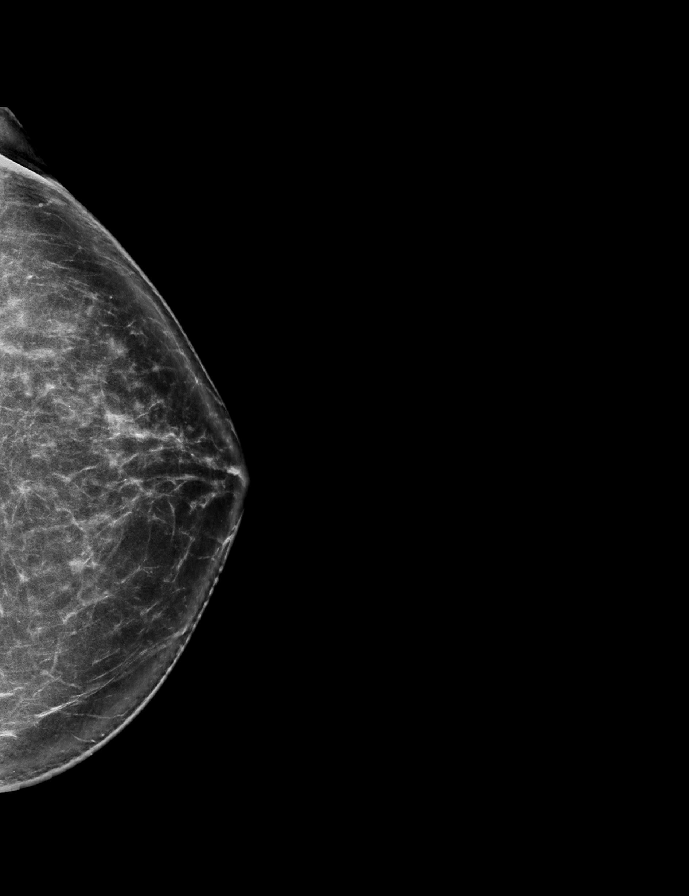

[R CC synth-2D]
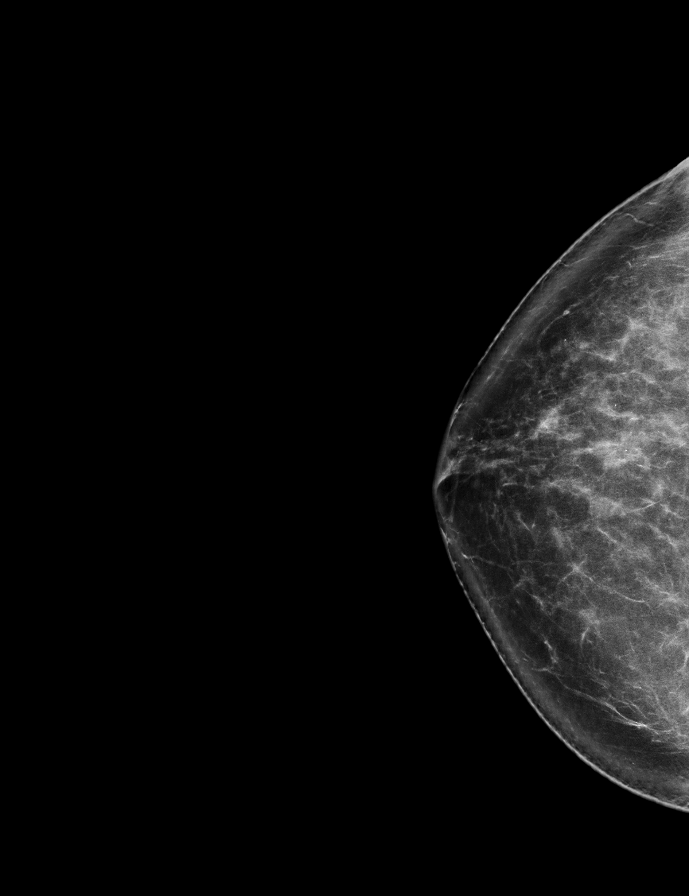

[R MLO]
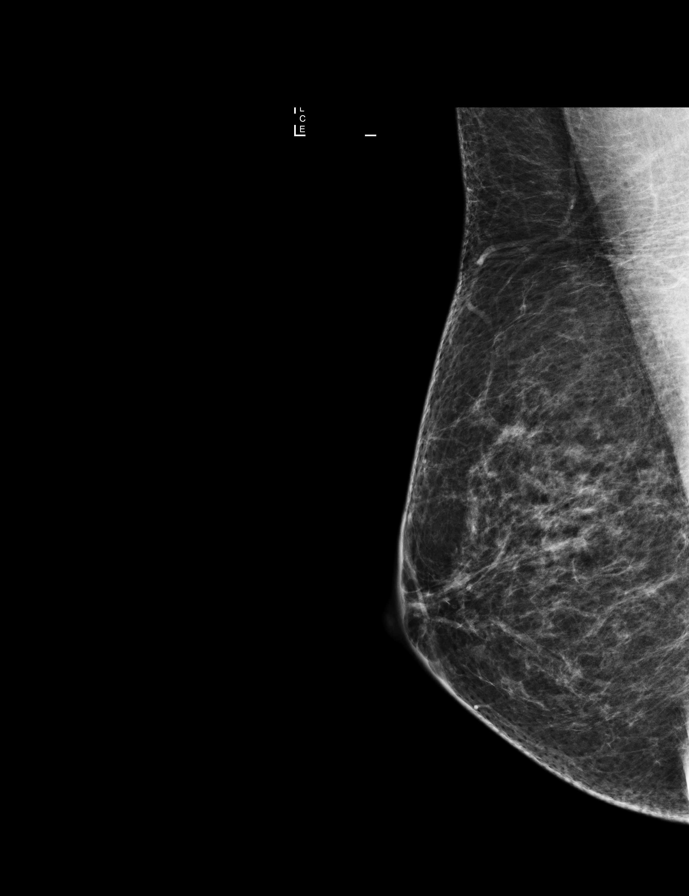

[R CC]
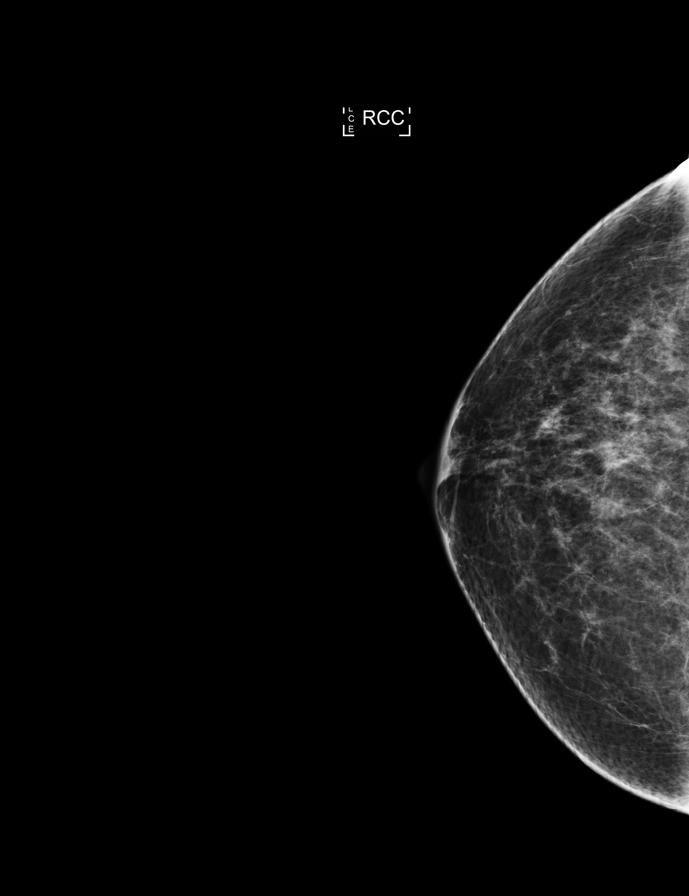

[L CC]
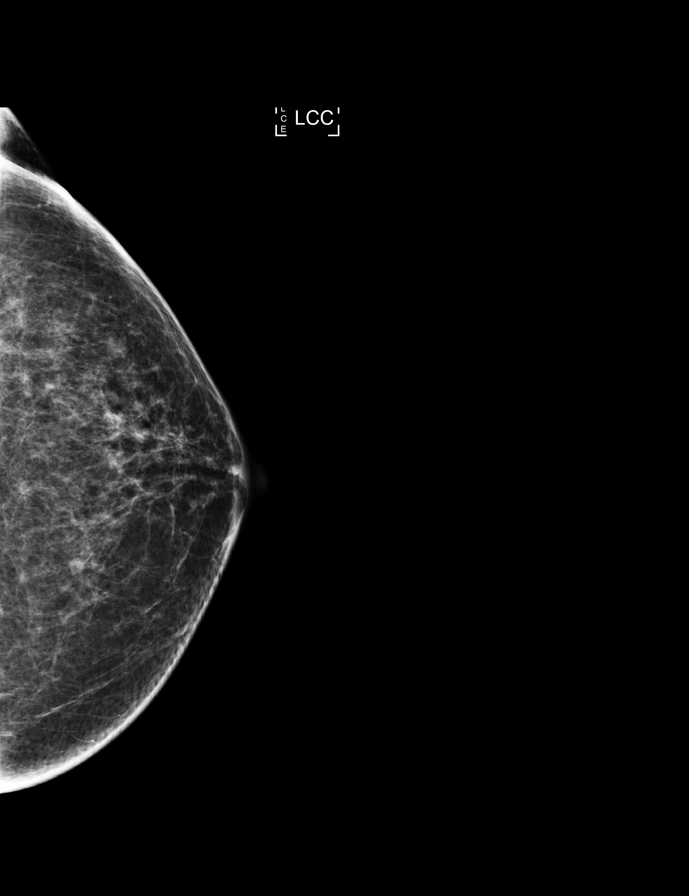

[R MLO synth-2D]
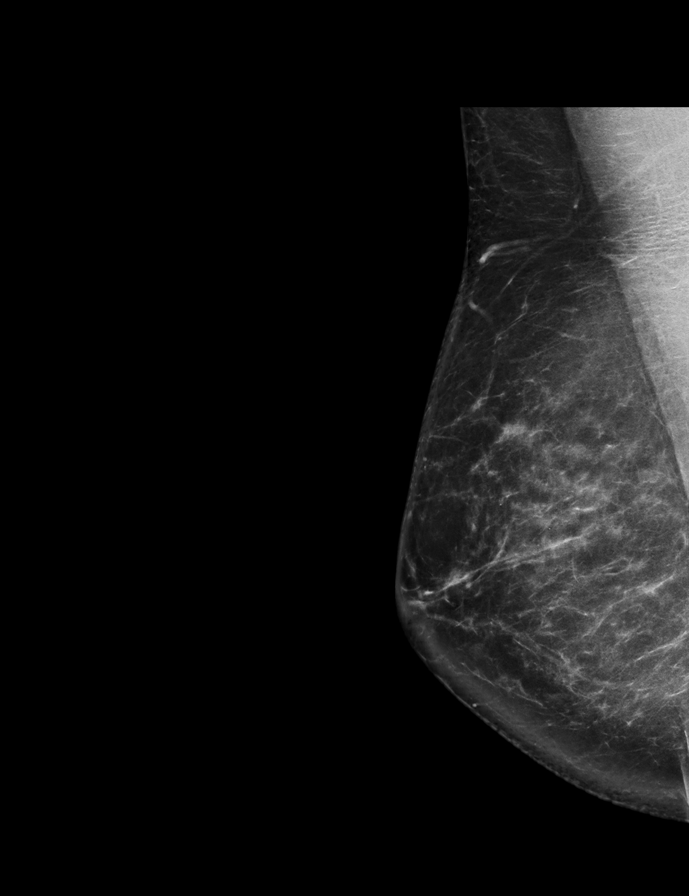

[L CC tomo · tomo slice 40/79.0]
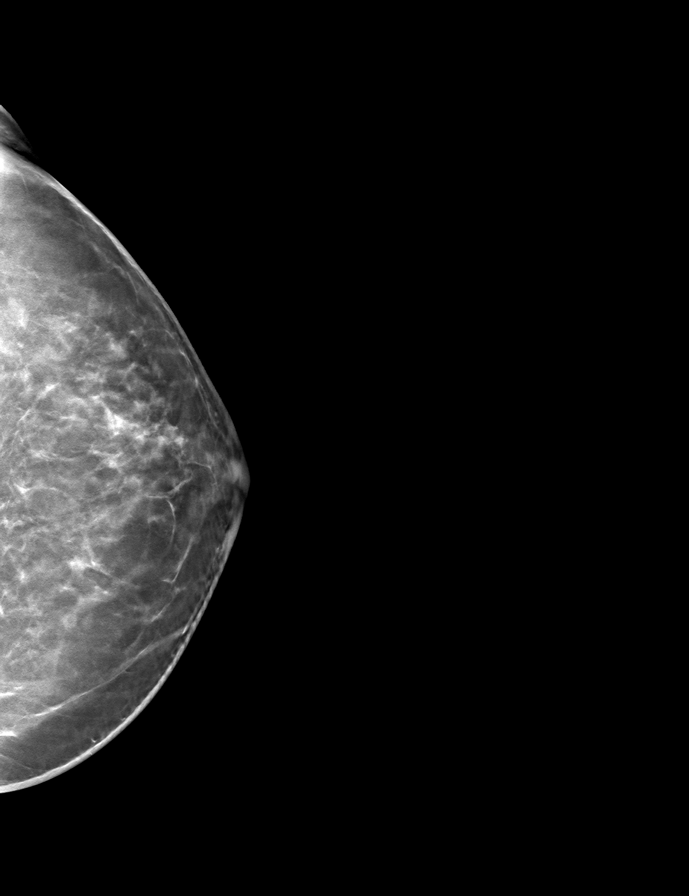

[9 of 28 positions shown; findings below may reference images not displayed]

ACR Breast Density Category b: There are scattered areas of
fibroglandular density.
FINDINGS: In the right breast, a possible mass warrants further evaluation. In
the left breast, no findings suspicious for malignancy. Images were
processed with CAD.
IMPRESSION: Further evaluation is suggested for possible mass in the right
breast.

RECOMMENDATION:
Diagnostic mammogram and possibly ultrasound of the right breast.
(Code:[id number])

The patient will be contacted regarding the findings, and additional
imaging will be scheduled.

BI-RADS CATEGORY  0: Incomplete. Need additional imaging evaluation
and/or prior mammograms for comparison.

## 2018-09-20 ENCOUNTER — Other Ambulatory Visit: Payer: BLUE CROSS/BLUE SHIELD

## 2019-01-08 IMAGING — DX DG PORTABLE PELVIS
1 series · 1 of 1 positions shown · non-contrast
Comparison: Intraoperative fluoro spot images of today's date

CLINICAL DATA: Status post right total hip joint prosthesis
placement.

EXAM:
PORTABLE PELVIS 1-2 VIEWS

[pelvis ap]
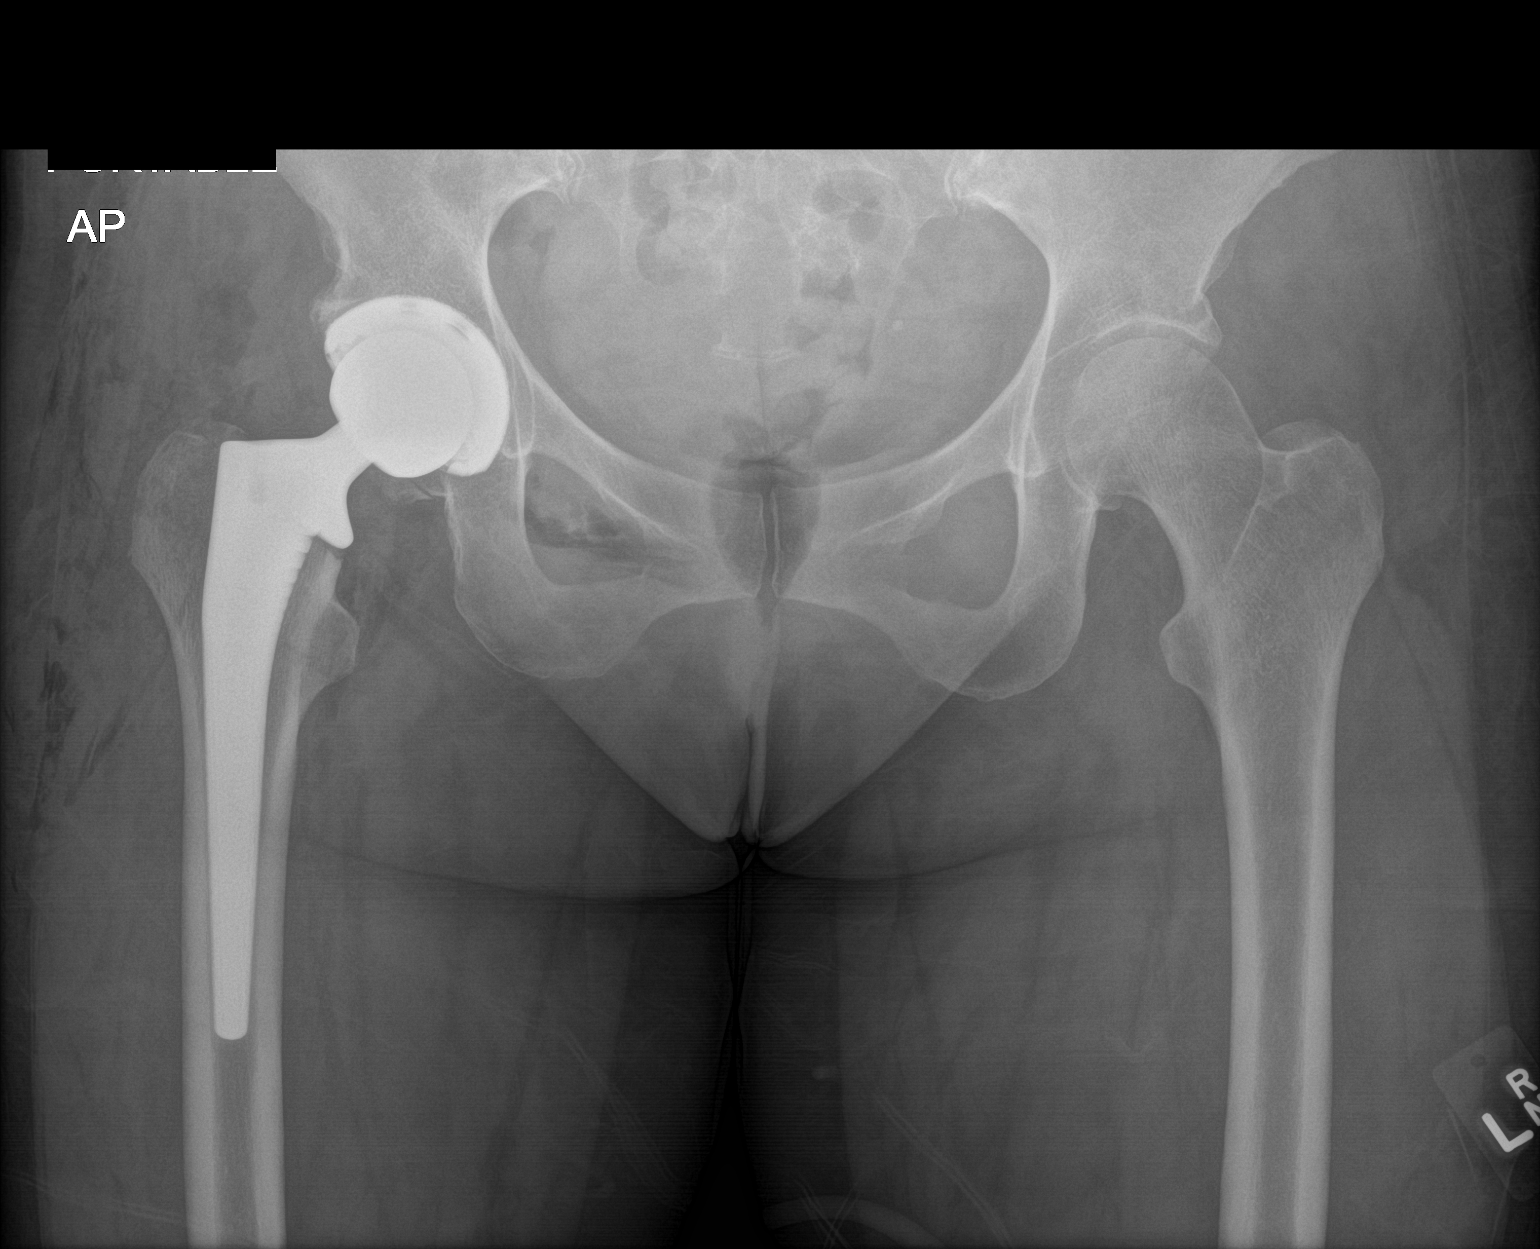

[1 of 1 positions shown; findings below may reference images not displayed]

FINDINGS: The patient has undergone right total hip joint prosthesis
placement. Radiographic positioning of the prosthetic components is
good. The interface with the native bone appears normal. There is no
acute native bone fracture. There is gas within the soft tissues of
the right thigh.
IMPRESSION: No immediate postprocedure complication following right hip joint
prosthesis placement.

## 2019-02-16 IMAGING — US US BREAST*R* LIMITED INC AXILLA
1 series · 6 of 6 positions shown · non-contrast
Comparison: September 2016

CLINICAL DATA: 51-year-old patient presents for follow-up right
breast ultrasound. A probably benign complicated cyst was described
on the right breast diagnostic mammogram and ultrasound September 25, 2016. It was detected on the screening mammogram September 11, 2016.
The patient is asymptomatic.

EXAM:
ULTRASOUND OF THE RIGHT BREAST

[Series 1: us breast*right* limited inc axilla · 0.06mm/px · 6 of 6 slices shown]
[im 1/6]
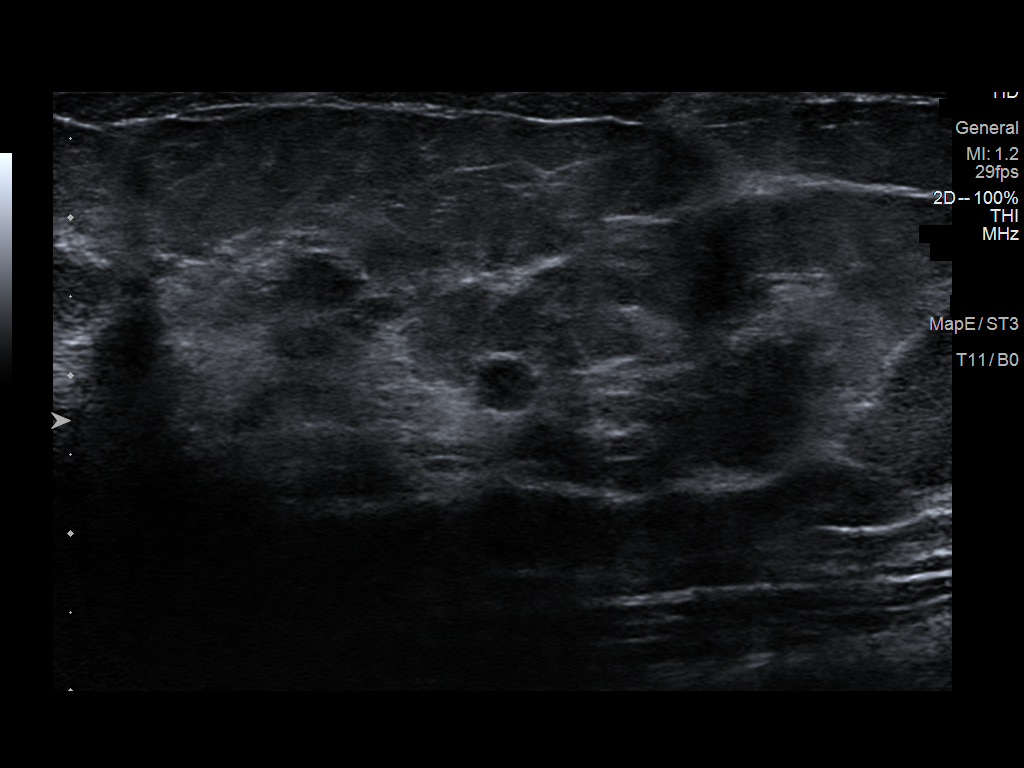
[im 2/6]
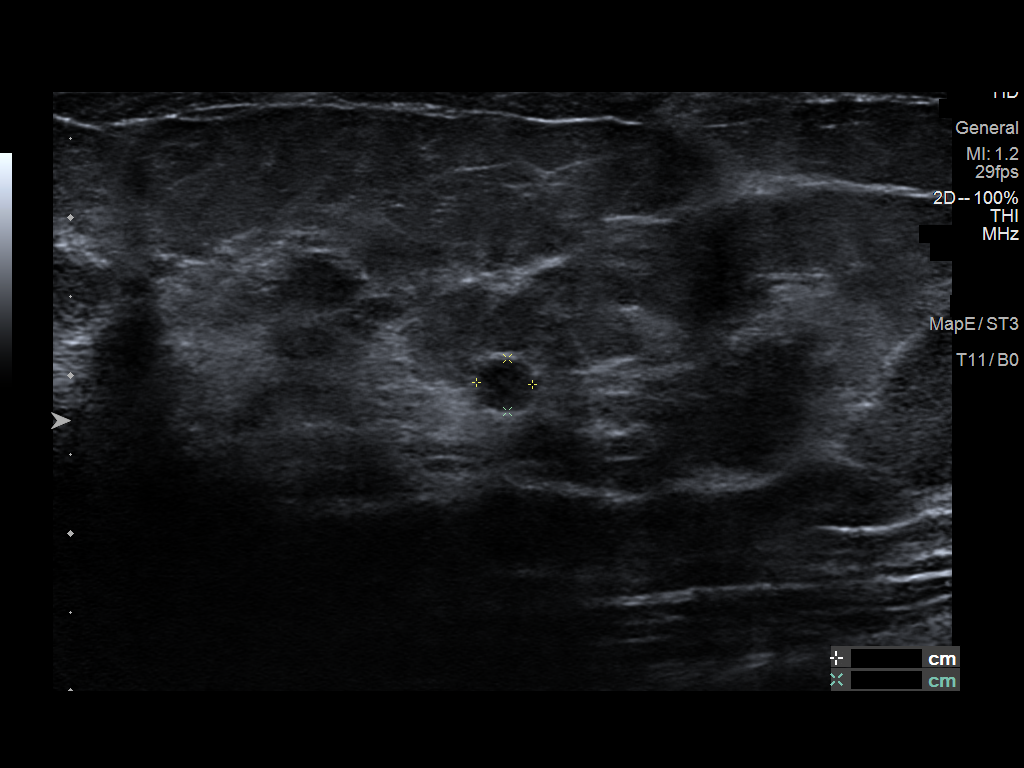
[im 3/6]
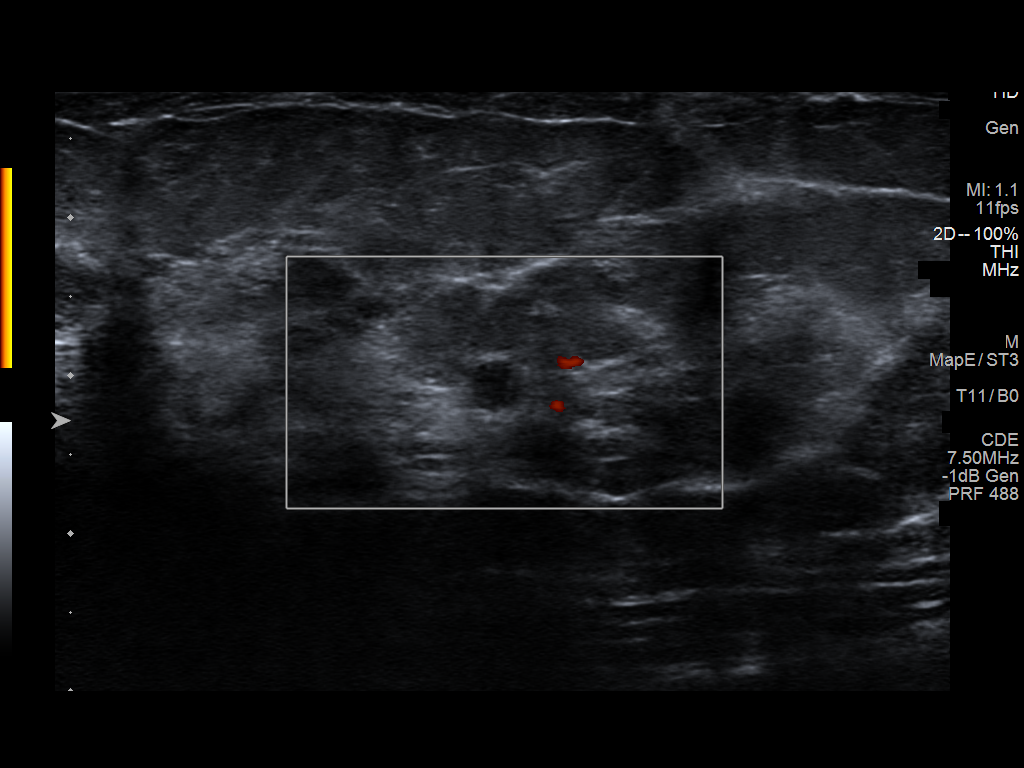
[im 4/6]
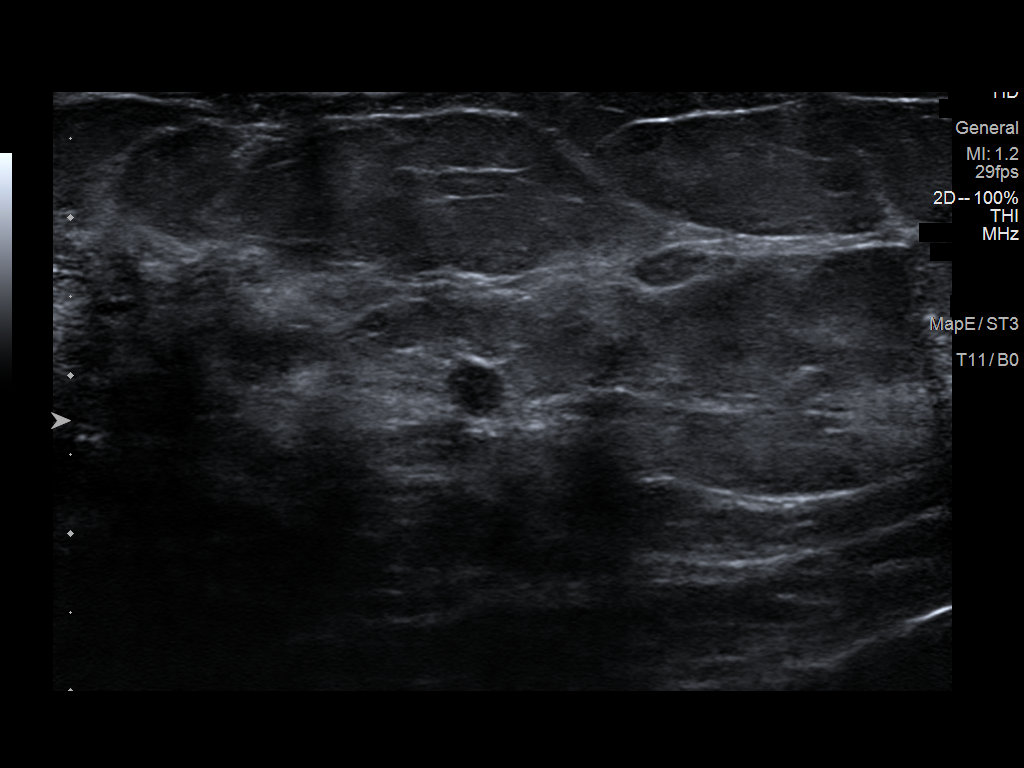
[im 5/6]
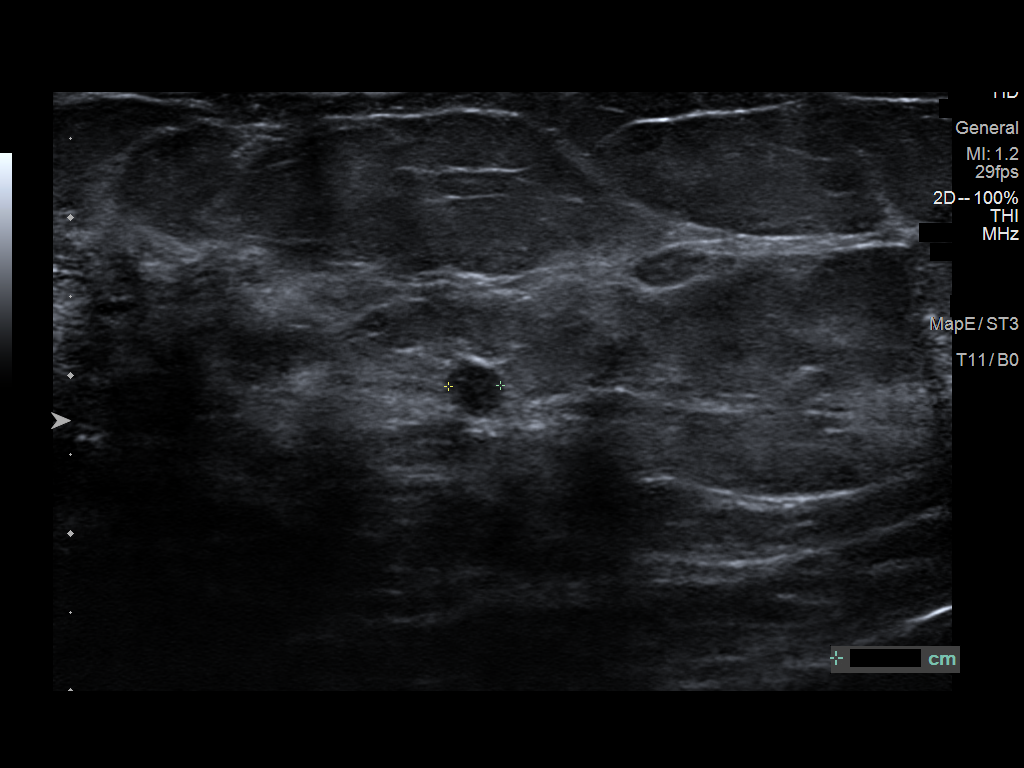
[im 6/6]
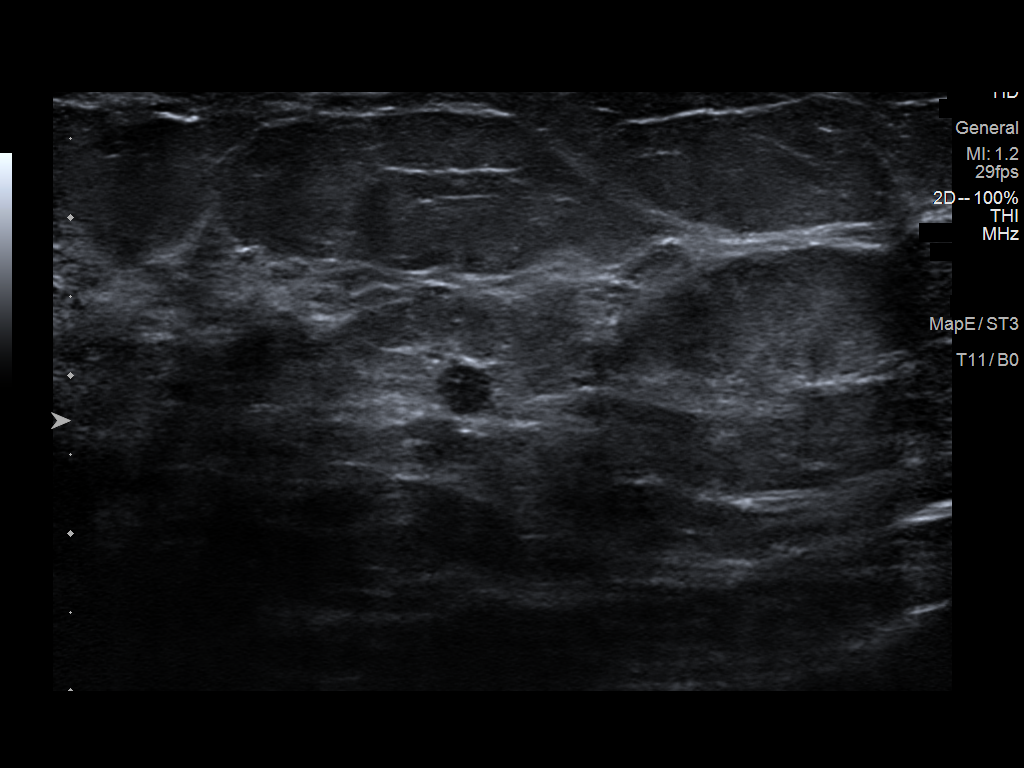

[6 of 6 positions shown; findings below may reference images not displayed]

FINDINGS: Targeted ultrasound is performed, showing a 0.4 x 0.3 x 0.3 cm
complicated cyst at [DATE] position 1 cm from the nipple. There is no
internal vascular flow. This measures slightly smaller compared to
the prior exam at which time it measured 0.5 cm greatest diameter.
IMPRESSION: Slight interval decrease in size of a probably benign complicated
cyst in the [DATE] position of the right breast.

RECOMMENDATION:
Bilateral diagnostic mammogram and right breast ultrasound is
recommended in September 2017.

I have discussed the findings and recommendations with the patient.
Results were also provided in writing at the conclusion of the
visit. If applicable, a reminder letter will be sent to the patient
regarding the next appointment.

BI-RADS CATEGORY  3: Probably benign.

## 2019-02-26 ENCOUNTER — Other Ambulatory Visit: Payer: Self-pay

## 2019-02-26 DIAGNOSIS — N951 Menopausal and female climacteric states: Secondary | ICD-10-CM

## 2019-02-26 DIAGNOSIS — Z01419 Encounter for gynecological examination (general) (routine) without abnormal findings: Secondary | ICD-10-CM

## 2019-02-26 MED ORDER — PREMPRO 0.625-2.5 MG PO TABS: 1.0000 | ORAL_TABLET | Freq: Every day | ORAL | 4 refills | Status: DC

## 2019-03-03 ENCOUNTER — Other Ambulatory Visit: Payer: Self-pay

## 2019-03-03 DIAGNOSIS — N951 Menopausal and female climacteric states: Secondary | ICD-10-CM

## 2019-03-03 DIAGNOSIS — Z01419 Encounter for gynecological examination (general) (routine) without abnormal findings: Secondary | ICD-10-CM

## 2019-03-03 MED ORDER — PREMPRO 0.625-2.5 MG PO TABS: 1.0000 | ORAL_TABLET | Freq: Every day | ORAL | 4 refills | Status: AC
